# Patient Record
Sex: Female | Born: 1970
Health system: Southern US, Community
[De-identification: ages and names within clinical notes are randomized; demographics above are authoritative.]

## PROBLEM LIST (undated history)

## (undated) DIAGNOSIS — M1990 Unspecified osteoarthritis, unspecified site: Secondary | ICD-10-CM

## (undated) DIAGNOSIS — C801 Malignant (primary) neoplasm, unspecified: Secondary | ICD-10-CM

## (undated) DIAGNOSIS — K219 Gastro-esophageal reflux disease without esophagitis: Secondary | ICD-10-CM

## (undated) DIAGNOSIS — A630 Anogenital (venereal) warts: Secondary | ICD-10-CM

## (undated) DIAGNOSIS — I10 Essential (primary) hypertension: Secondary | ICD-10-CM

## (undated) HISTORY — DX: Anogenital (venereal) warts: A63.0

## (undated) HISTORY — PX: CHOLECYSTECTOMY: SHX55

## (undated) HISTORY — PX: LYMPH NODE DISSECTION: SHX5087

## (undated) HISTORY — PX: DILATION AND CURETTAGE OF UTERUS: SHX78

## (undated) HISTORY — DX: Malignant (primary) neoplasm, unspecified: C80.1

## (undated) HISTORY — DX: Gastro-esophageal reflux disease without esophagitis: K21.9

## (undated) HISTORY — PX: OTHER SURGICAL HISTORY: SHX169

---
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
## 1996-10-29 HISTORY — PX: APPENDECTOMY: SHX54

## 1997-10-29 HISTORY — PX: OTHER SURGICAL HISTORY: SHX169

## 1997-10-29 HISTORY — PX: OVARIAN CYST REMOVAL: SHX89

## 2001-09-23 ENCOUNTER — Other Ambulatory Visit: Admission: RE | Admit: 2001-09-23 | Discharge: 2001-09-23 | Payer: Self-pay | Admitting: Obstetrics and Gynecology

## 2002-05-31 ENCOUNTER — Emergency Department (HOSPITAL_COMMUNITY): Admission: EM | Admit: 2002-05-31 | Discharge: 2002-06-01 | Payer: Self-pay | Admitting: *Deleted

## 2002-12-07 ENCOUNTER — Other Ambulatory Visit: Admission: RE | Admit: 2002-12-07 | Discharge: 2002-12-07 | Payer: Self-pay | Admitting: Obstetrics and Gynecology

## 2004-01-03 ENCOUNTER — Other Ambulatory Visit: Admission: RE | Admit: 2004-01-03 | Discharge: 2004-01-03 | Payer: Self-pay | Admitting: Obstetrics and Gynecology

## 2007-04-04 ENCOUNTER — Emergency Department (HOSPITAL_COMMUNITY): Admission: EM | Admit: 2007-04-04 | Discharge: 2007-04-04 | Payer: Self-pay | Admitting: Emergency Medicine

## 2007-07-25 ENCOUNTER — Ambulatory Visit (HOSPITAL_COMMUNITY): Admission: RE | Admit: 2007-07-25 | Discharge: 2007-07-25 | Payer: Self-pay | Admitting: *Deleted

## 2007-07-25 ENCOUNTER — Encounter: Payer: Self-pay | Admitting: *Deleted

## 2007-09-18 ENCOUNTER — Inpatient Hospital Stay (HOSPITAL_COMMUNITY): Admission: AD | Admit: 2007-09-18 | Discharge: 2007-09-18 | Payer: Self-pay | Admitting: Obstetrics and Gynecology

## 2007-09-19 ENCOUNTER — Inpatient Hospital Stay (HOSPITAL_COMMUNITY): Admission: AD | Admit: 2007-09-19 | Discharge: 2007-09-19 | Payer: Self-pay | Admitting: Obstetrics and Gynecology

## 2007-09-28 ENCOUNTER — Inpatient Hospital Stay (HOSPITAL_COMMUNITY): Admission: AD | Admit: 2007-09-28 | Discharge: 2007-09-28 | Payer: Self-pay | Admitting: Obstetrics

## 2007-09-29 ENCOUNTER — Inpatient Hospital Stay (HOSPITAL_COMMUNITY): Admission: AD | Admit: 2007-09-29 | Discharge: 2007-10-19 | Payer: Self-pay | Admitting: Obstetrics

## 2007-10-15 ENCOUNTER — Encounter (INDEPENDENT_AMBULATORY_CARE_PROVIDER_SITE_OTHER): Payer: Self-pay | Admitting: Obstetrics

## 2007-11-07 ENCOUNTER — Encounter (INDEPENDENT_AMBULATORY_CARE_PROVIDER_SITE_OTHER): Payer: Self-pay | Admitting: Obstetrics and Gynecology

## 2007-11-07 ENCOUNTER — Ambulatory Visit (HOSPITAL_COMMUNITY): Admission: AD | Admit: 2007-11-07 | Discharge: 2007-11-07 | Payer: Self-pay | Admitting: Obstetrics and Gynecology

## 2009-08-12 IMAGING — US US UA ADDL GEST RE-EVAL
1 series · 14 of 16 positions shown · non-contrast
Comparison: none

OBSTETRICAL ULTRASOUND:

 This ultrasound exam was performed in the [HOSPITAL] Ultrasound Department.  The OB US report was generated in the AS system, and faxed to the ordering physician.  This report is also available in [REDACTED] PACS.

[Series 1: us ua addl gest re-eval · 0.30mm/px · 14 of 49 slices shown]
[im 1/49]
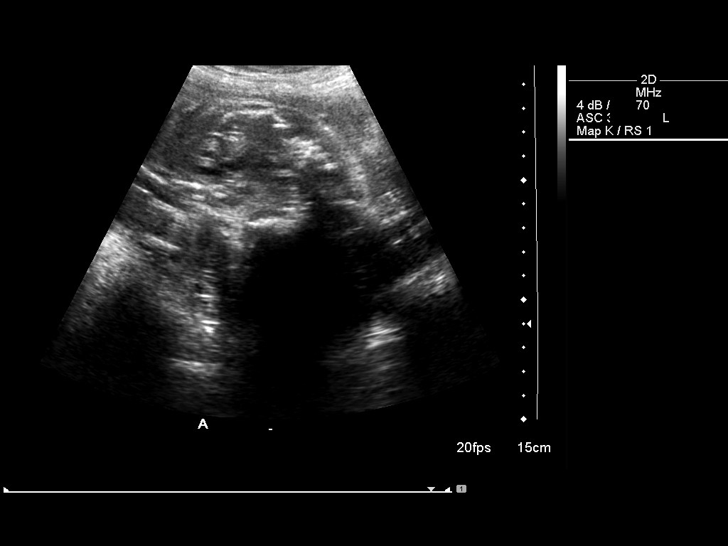
[im 4/49]
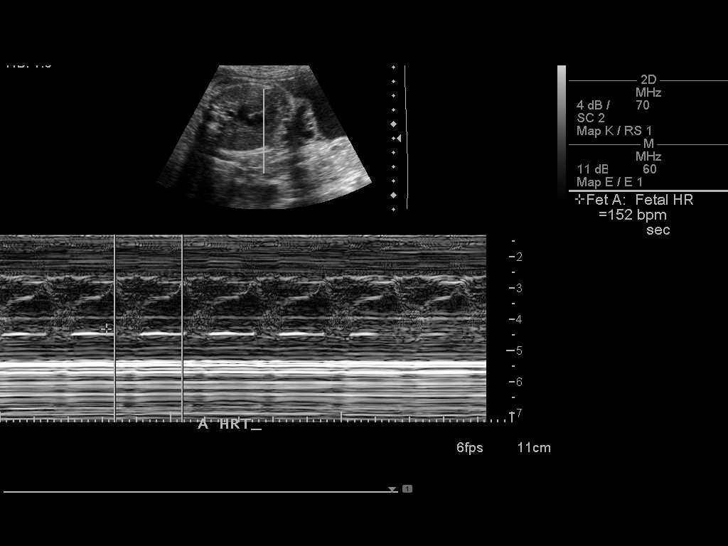
[im 7/49]
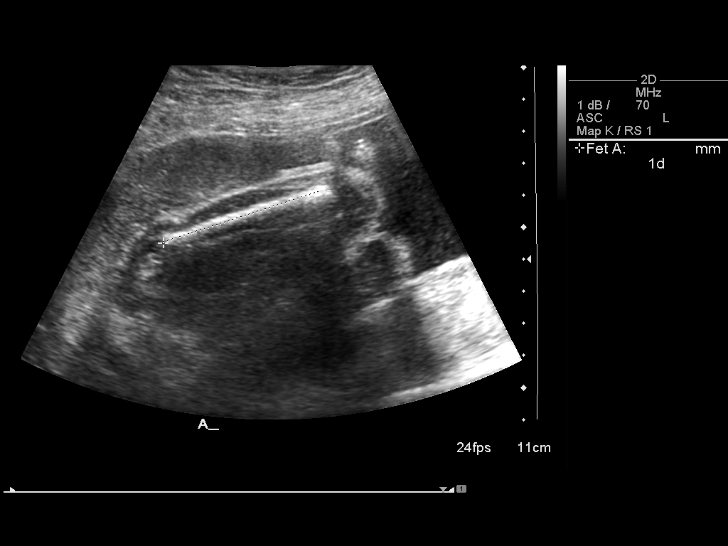
[im 13/49]
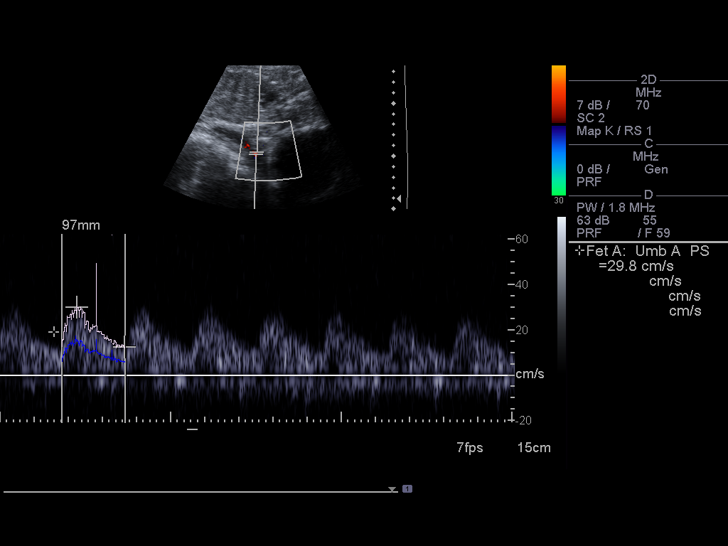
[im 17/49]
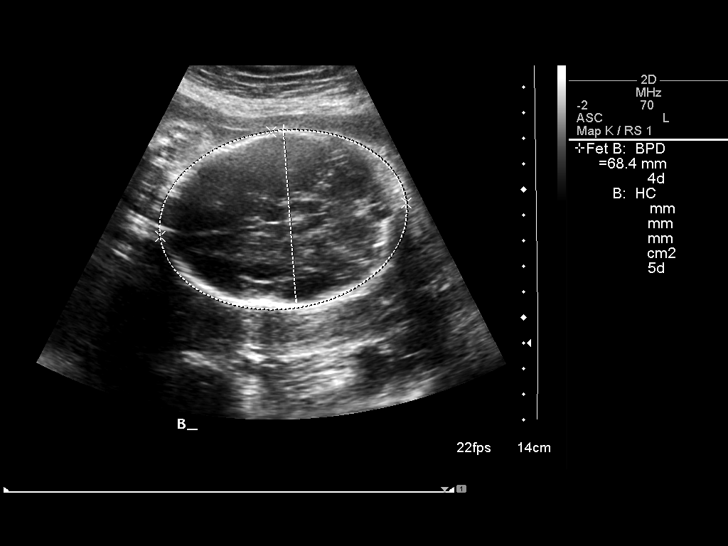
[im 20/49]
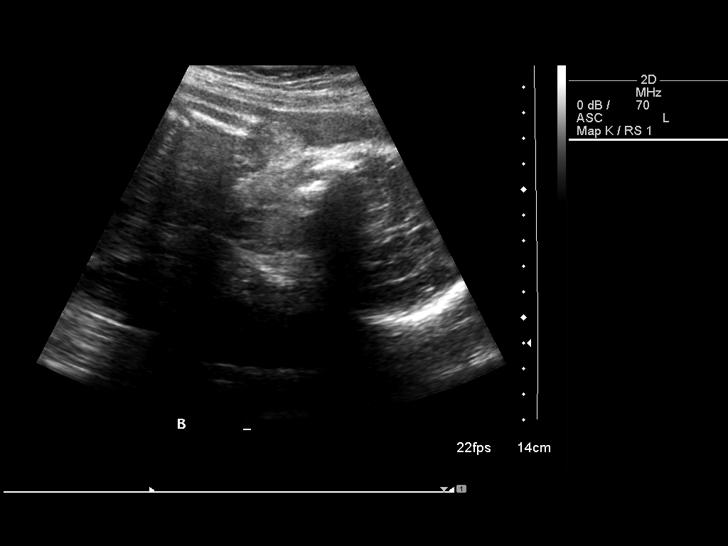
[im 23/49]
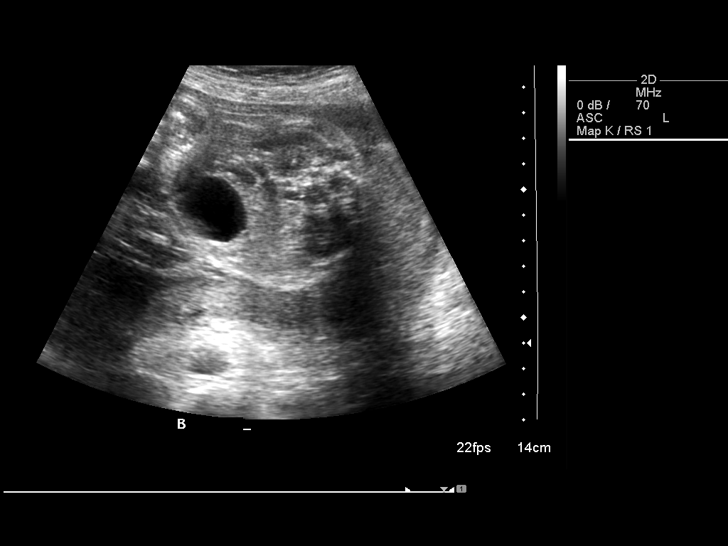
[im 26/49]
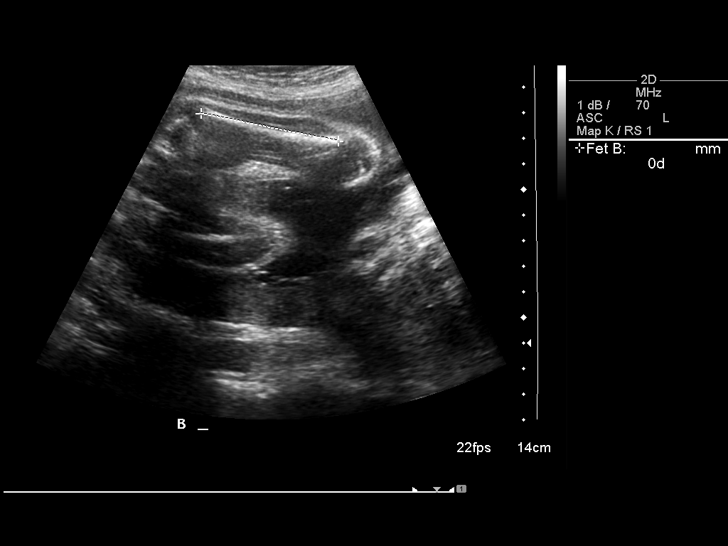
[im 29/49]
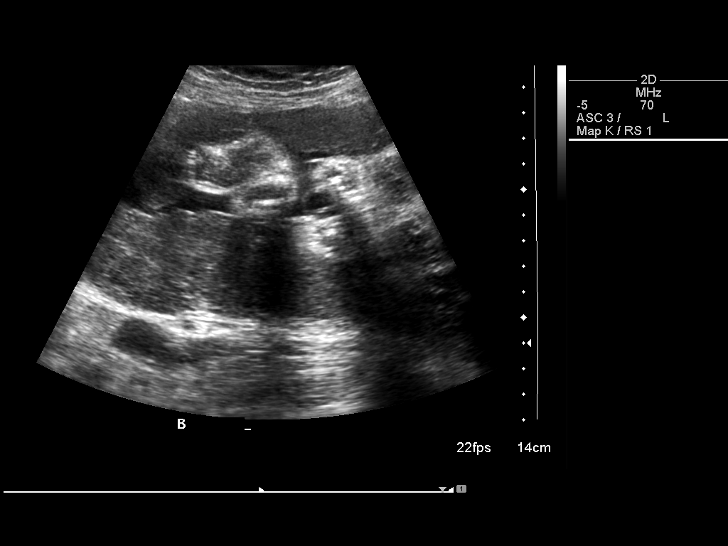
[im 33/49]
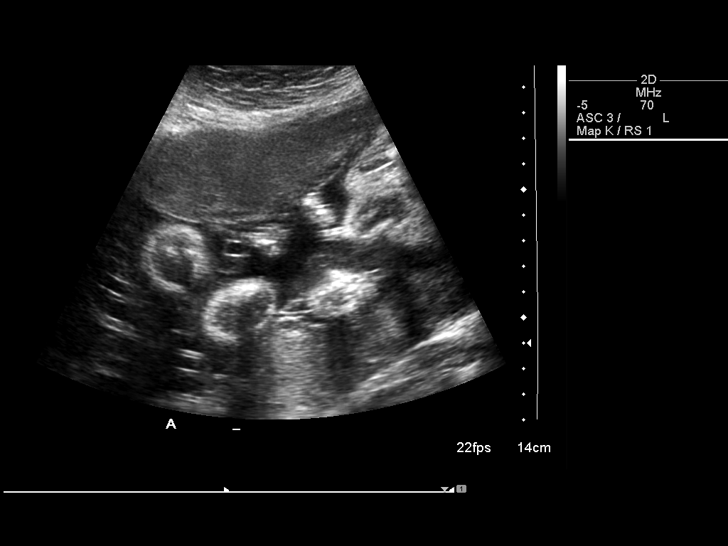
[im 39/49]
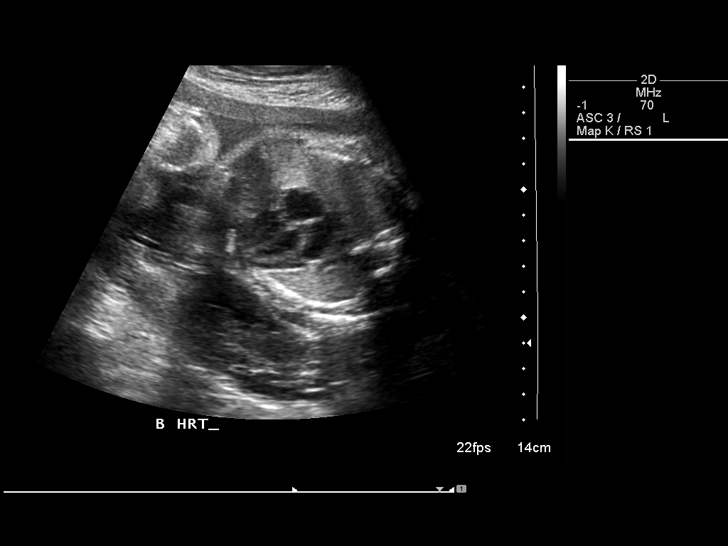
[im 42/49]
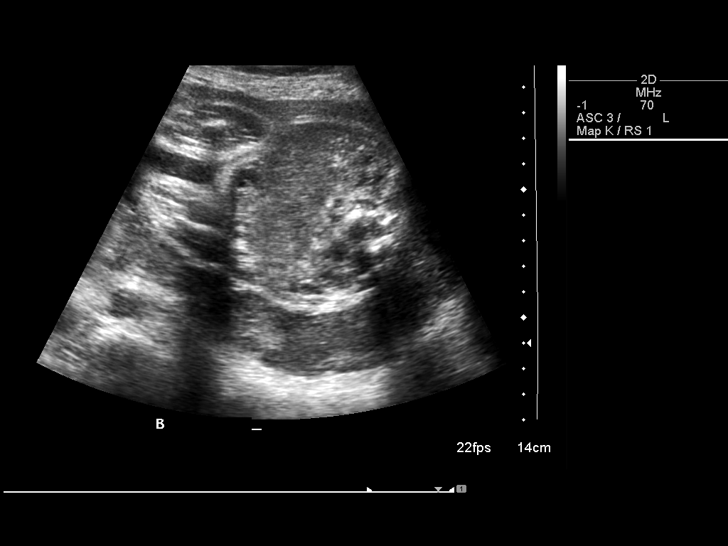
[im 45/49]
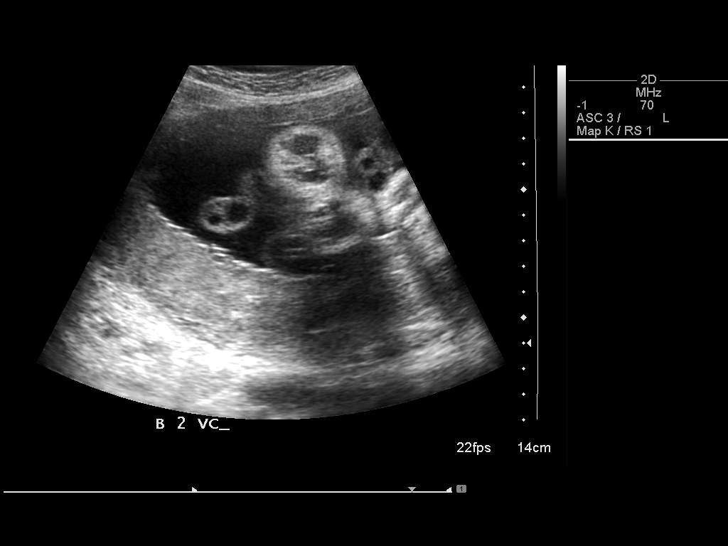
[im 49/49]
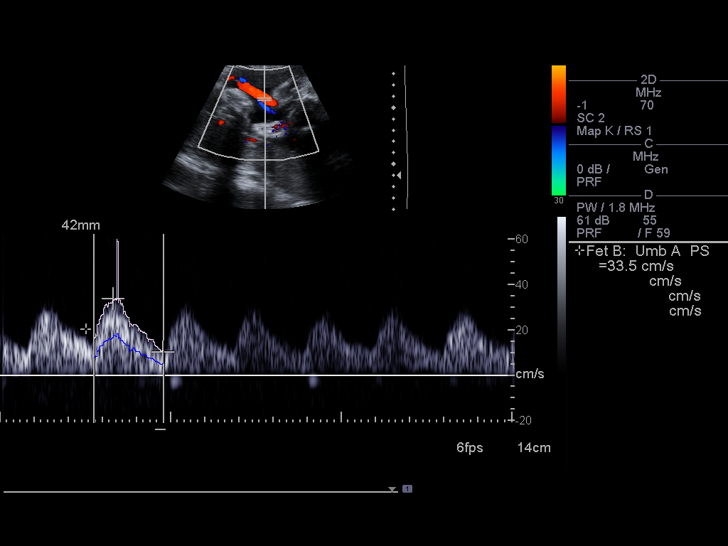

[14 of 16 positions shown; findings below may reference images not displayed]

IMPRESSION: See AS Obstetric US report.

## 2011-03-13 NOTE — Op Note (Signed)
[name] [name], [name] [name]            ACCOUNT NO.:  [id number]   MEDICAL RECORD NO.:  [id number]          PATIENT TYPE:  AMB   LOCATION:  SDC                           FACILITY:  [facility]   PHYSICIAN:  Lomita B. Earlene Plater, M.D.  DATE OF BIRTH:  12-06-1970   DATE OF PROCEDURE:  07/25/2007  DATE OF DISCHARGE:                               OPERATIVE REPORT   PREOPERATIVE DIAGNOSIS:  1. Eighteen-plus week pregnancy with twins.  2. Cervical incompetence.   PROCEDURE:  McDonald cerclage.   SURGEON:  Chester Holstein. Earlene Plater, M.D.   ASSISTANT:  None.   ANESTHESIA:  Spinal.   FINDINGS:  Shortened cervix.   BLOOD LOSS:  Minimal.   COMPLICATIONS:  None.   INDICATIONS:  Patient with 18-plus week twins, on routine ultrasound in  the office yesterday noted to have a shortened cervix in the 16-18 mm  range.  Maternal-fetal medicine consultation was obtained today.  The  [facility] and a shortened  cervix again confirmed.  There was no further shortening or dilation  Valsalva or fundal pressure; however, a persistently shortened cervix  was again noted.  The patient had a consultation with Dr. Ander Slade of MFM and  myself.  We discussed as the uncertainty of the cervical shortening in  this setting.  She has no history of preterm birth in the past.  However, this is a twin pregnancy after IVF.  Therefore, the patient was  counseled that she is at increased risk for prematurity.  There is  limited evidence for cervical cerclage; however, it is felt to be of  minimal risk and some potential benefit.  Therefore, the patient wished  to proceed with cervical cerclage.  She was advised of the risks of  surgery including infection, bleeding, damage to surrounding organs.   PROCEDURE:  The patient taken to the operating room and spinal  anesthesia obtained.  She was prepped and draped with Hibiclens.  In-and-  out catheterization of the bladder obtained.  Speculum inserted.   The  cervix was noted to be clinically shortened, about 1 cm of visible  cervical length, no dilation.  A #0 Prolene suture was placed in the  substance of the cervix in a pursestring manner with good support  obtained.  Hemostasis obtained.   The instruments were removed.  Cervix was hemostatic.  The patient  tolerated the procedure well and there were no complications.  She was  taken to the recovery room awake, alert, in stable condition.      Gerri Spore B. Earlene Plater, M.D.  Electronically Signed     WBD/MEDQ  D:  07/25/2007  T:  07/26/2007  Job:  [id number]

## 2011-03-13 NOTE — Op Note (Signed)
[name], [name]            ACCOUNT NO.:  [id number]   MEDICAL RECORD NO.:  [id number]          PATIENT TYPE:  INP   LOCATION:  9303                          FACILITY:  [facility]   PHYSICIAN:  Lendon Colonel, MD   DATE OF BIRTH:  June 05, 1971   DATE OF PROCEDURE:  10/15/2007  DATE OF DISCHARGE:                               OPERATIVE REPORT   PREOPERATIVE DIAGNOSIS:  30 week twin pregnancy, pre-term labor,  cervical insufficiency, pre-term premature rupture of membranes.   POSTOPERATIVE DIAGNOSIS:  30 week twin pregnancy, pre-term labor,  cervical insufficiency, pre-term premature rupture of membranes.   PROCEDURE:  Primary low transverse cesarean section, manual removal of  placenta, repair of bladder laceration.   ESTIMATED BLOOD LOSS:  1000 mL.   ANTIBIOTICS:  Mefoxin.   COMPLICATIONS:  Bladder laceration.   SURGEON:  Lendon Colonel, M.D.   ASSISTANT:  Marlinda Mike, C.N.M.   INTRAOPERATIVE CONSULTATIONS:  Dr. Isabel [name]- Urology.   FINDINGS:  Bladder densely adherent to anterior uterine wall.  Colon  adherent to uterine fundus.  Dense pelvic adhesions, 6 cm bladder  laceration.  Baby A formerly known as baby B in the vertex position,  Apgars 8/9.  Baby B formerly known as baby A, also in the vertex  presentation, delivered breech, Apgars 6 and 7.  The uterus densely  adherent to the left cornua.   DESCRIPTION OF PROCEDURE:  After informed consent was obtained, risks  and benefits of cesarean section, especially in light of two prior  laparotomies and history of ruptured appendix, were discussed with the  patient, the patient declined a trial of labor.  The decision was made  to proceed to the operating room for primary low transverse cesarean  section at 30 weeks given advancing labor.  After spinal anesthesia was  initiated, the patient was prepped and draped in the normal sterile  fashion in the dorsal supine position.  A Foley catheter was inserted  into the  bladder.  A Pfannenstiel skin incision was made 2 cm above the  pubic symphysis in the midline with the scalpel and carried down to the  underlying layer of fascia with the Bovie.  Of note, the old laparotomy  incisions were not used as they were felt to be too low in the pelvis.  Once the fascia was identified, the fascia was entered sharply with the  Bovie cautery and the fascial incision was extended laterally with the  Mayo scissors.  The inferior aspect of the fascial incision was grasped  with Kocher clamps, elevated up, and the underlying rectus muscles were  dissected off sharply.  Attention was turned to the superior aspect of  the fascial incision which, in a similar fashion, was grasped with  Kocher clamps x2, elevated up, and the underlying rectus muscles  dissected off sharply.  The rectus muscles were separated  in the  midline.  Dense adhesions were noted upon trying to enter the peritoneal  cavity.  Pickups and Metzenbaum scissors were used to enter into the  peritoneal cavity.  Increased vascularity was noted and great care was  taken entering the  peritoneal cavity.  During this dissection, the  bladder was entered and an approximately 6 cm incision was made in the  dome of the bladder.  The bladder edges were tagged with Allis clamps.  The peritoneal incision was extended superiorly and inferiorly.  The  bladder was then able to be dissected off the lower uterine segment.  The bladder blade was inserted.  The vesicouterine peritoneum  identified, grasped with pickups, and a bladder flap was created  sharply.  The bladder blade was reinserted to retract the bladder.  Manual exploration was done of the patient's pelvis to assess for the  uterine incision.  The decision was made to proceed with a low  transverse cesarean section.  Of note, the uterus was noted to have  dense adhesions both from the bladder inferiorly and the colon  superiorly.  The ovaries were unable to be  examined and it was felt that  the uterus would not be able to be exteriorized.   A transverse incision was made in the lower uterine segment with the  scalpel and extended with the bandage scissors.  Initial attempts at  delivering baby A were unsuccessful due to the presence of the non-  presenting twin.  The presenting twin was felt to be deep in the pelvis  and was unable to be delivered.  At this point, the amniotic sac of the  non-presenting twin was ruptured and the baby formerly known as baby B  was delivered in the vertex position as baby A.  The cord was clamped  and cut and the infant was handed off to the awaiting pediatrician.  The  presenting twin was unable to be delivered in the vertex position and  was delivered in the breech position.  The infant was delivered up to  the hips.  The arms were swept across the baby's chest and the head was  delivered and suctioned.  The cord was clamped and cut and the infant  handed off to the awaiting pediatrician.  Several minutes were spent  attempting to express the placenta.  This was unsuccessful and a portion  of the placenta was noted to be densely adherent to the left cornua.  Approximately eight minutes were spent in creating a plane between the  placenta and the left cornua.  The placenta of the non-presenting twin  delivered easily.  The placenta of the presenting twin delivered as twin  B remained stuck in the left cornua.  At this point, consideration of a  cesarean hysterectomy was given.  The patient was crossed for 2 units of  blood.  The hysterectomy tray was called for and anesthesia was alerted  to the current situation.  After about eight additional minutes, the  placenta was delivered and the placental bed site was hemostatic.  No  gross retained placenta was noted.   The incision was closed with 0 Vicryl in a running locked suture.  A  second layer of the same suture was used in an imbricating fashion.  Again,  there was no bruising on the uterus.  The uterus was not  expanding and no active bleeding was noted.  The thought at this time  was that most, if not all, of the placenta was removed.  Intraoperative  urology consult was called for and that note will be dictated as a  separate dictation.  In short, a running layer of 3-0 Vicryl was used to  primarily close the bladder incision after inspecting the ureteral  orifices.  A second layer of 2-0 Vicryl in a Lambert imbricating fashion  was used to close the bladder.  After the urologist finished his repair,  the uterine incision was reinspected and found to be hemostatic.  The  uterus was found to be firm with no bruising and no bleeding.  The  tagged uterine incisions were cut.  The uterus was again unable to be  exteriorized.  The adnexa were unable to be assessed secondary to dense  adhesions throughout the pelvis.  A 2-0 Vicryl was used to reapproximate  the rectus muscles in the midline to help keep the bladder from adhering  directly to the fascia.  The fascia was closed with 0 Vicryl in a  running locked fashion after cut muscle edges and under side of the  fascia were inspected and found to be hemostatic.  The skin was closed  with staples.  She tolerated the procedure well.  Sponge, lap and needle  counts were correct x 3.  The patient was taken to the recovery area in  stable condition.      Lendon Colonel, MD  Electronically Signed     KAF/MEDQ  D:  10/15/2007  T:  10/15/2007  Job:  [id number]

## 2011-03-13 NOTE — Discharge Summary (Signed)
NAMESHAKEIRA, RHEE            ACCOUNT NO.:  [id number]   MEDICAL RECORD NO.:  [id number]          PATIENT TYPE:  INP   LOCATION:  9303                          [facility]ACILITY:  [facility]   PHYSICIAN:  Lendon Colonel, MD   DATE OF BIRTH:  July 22, 1971   DATE OF ADMISSION:  09/29/2007  DATE OF DISCHARGE:  10/19/2007                               DISCHARGE SUMMARY   CHIEF COMPLAINT:  PPROM twin A.   HISTORY OF PRESENT ILLNESS:  This is a 40 year old G1 admitted at 27  weeks and 5 days after premature preterm rupture of membranes of Baby A.  Of note, patient's pregnancy is conceived via in vitro fertilization  with di-di twins approximately 19 weeks of gestation.  Patient was noted  to have asymptomatic cervical shortening and dilation.  At that time  patient was placed on progesterone vaginal suppositories and emergent  cerclage was placed.  After that, patient was discharged to home with  weekly followup for cerclage checks.  On September 28, 2007, patient  presented to the ER with complaint of leaking of fluid.  Fern, Nitrazine  and pooling test were negative and patient was discharged to home.  The  following day patient had increased fluid from vagina and rupture of  membranes of Twin A was confirmed.  The patient was admitted to the  hospital with IV antibiotics for 2 days and a p.o. course for the  remaining 5 days.  A discussion on mode of delivery was had with the  patient.  The decision was made to proceed with cesarean section for  mode of delivery.  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  Patient was told at her most  recent laparotomy that she does have a good deal of scar tissue in her  abdomen.  This was noted by all physicians.  On December 1, after  initiation of antibiotics, patient was given two doses of betamethasone.  An ultrasound was performed for  fetal weight and position and NICU  consult was obtained.  On admission, patient showed no signs or symptoms  of chorioamnionitis and BPP on fetal A was 6/8 with normal uterine  artery Dopplers.  Baby B was noted to have a two-vessel cord but  reactive testing.  Fetal monitoring of Baby A was difficult secondary to  the oligohydramnios and the low position in the pelvis.  Given the  inability to monitor fetus A for the first week of her in-house  observation, patient was getting daily BPPs.  Will continue this  dictation at a later time.      Lendon Colonel, MD  Electronically Signed     KAF/MEDQ  D:  10/19/2007  T:  10/20/2007  Job:  [id number]

## 2011-03-13 NOTE — Op Note (Signed)
NAMEYAZMEN, [name] [name]            ACCOUNT NO.:  [id number]   MEDICAL RECORD NO.:  [id number]          PATIENT TYPE:  INP   LOCATION:  9303                          [facility]   PHYSICIAN:  Valetta Fuller, M.D.  DATE OF BIRTH:  05/22/1971   DATE OF PROCEDURE:  DATE OF DISCHARGE:                               OPERATIVE REPORT   PREOPERATIVE DIAGNOSIS:  Inadvertent cystotomy during cesarean section.   POSTOPERATIVE DIAGNOSIS:  Inadvertent cystotomy during cesarean section.   PROCEDURE PERFORMED:  Intraoperative consultation with closure of large  cystotomy.   SURGEON:  Dr. Barron Alvine.   ASSISTANT:  Dr. Noland Fordyce.   ANESTHETIC:  Spinal.   INDICATIONS:  [name] [name] is a 40 year old female who had undergone  cesarean section for twin gestation.  During the procedure which was  complicated by previous laparotomies and some adhesions, a cystotomy was  created at the dome of the bladder.  This was approximately 7-8 cm in  length.  Careful inspection of the bladder revealed no injury down at  the trigone.  The cystotomy was limited to the extreme dome of the  bladder.  The bladder wall appeared reasonably thick along the entire  cystotomy and had excellent blood supply.  Closure was performed with a  running 3-0 Vicryl suture incorporating the mucosa and some of the  muscularis.  Interrupted Lembert sutures were then placed to provide a  second layer of closure along the entire cystotomy incision.  We  instructed nursing personnel to please change her 16-French catheter to  a larger catheter, presumably 20-22 [place].  We suggested that the  patient have a cystogram 7-10 days postoperatively and if no leakage  occurred to go ahead and remove the catheter with antibiotic coverage  for approximately 5 days starting a day or so prior to the cystogram and  voiding trial.  The rest of this operative report including the cesarean  section, closure, etc. will be dictated separately  by Dr. Ernestina Penna. No  other obvious problems or complications occurred.           ______________________________  Valetta Fuller, M.D.  Electronically Signed     DSG/MEDQ  D:  10/15/2007  T:  10/15/2007  Job:  [id number]

## 2011-03-13 NOTE — Discharge Summary (Signed)
[name] [name], [name] [name]            ACCOUNT NO.:  [id number]   MEDICAL RECORD NO.:  [id number]          PATIENT TYPE:  INP   LOCATION:  9303                          [facility]   PHYSICIAN:  Lendon Colonel, MD   DATE OF BIRTH:  1970/12/10   DATE OF ADMISSION:  09/29/2007  DATE OF DISCHARGE:  10/19/2007                               DISCHARGE SUMMARY   On December 5th, patient had new onset of vaginal bleeding and passage  of large clot.  She was afebrile.  Her vital signs were stable and  tocometry was now picking up contractions every 6 minutes.  Baby B was  reactive and appropriate for gestational age, as was baby A although  monitoring was intermittent.  A sterile speculum exam was performed  which noted 1-2 spots of blood in the vagina, small clot at the os.  The  cervix had appeared closed and 1 cm long.  The cervix appeared intact at  that time.  A CBC was checked which resulted as normal in attempt to  rule out chorioamnionitis.  Plan was for a primary C-section should  contractions continue.  Over the next several hours, contractions  continued despite normal white blood cell count.  Given continued  contractions, decision was made to cut the cerclage.  This was done  without complication.  An ultrasound was performed.  Baby A had an  anterior placenta with oligohydramnios and baby B with posterior  placenta with normal fluids.  Decision was made to proceed with  expectant management and continuation of latency antibiotics.  After  that episode, patient continued to do well with no further signs or  symptoms of chorio or advancing preterm labor.  During ultrasound  monitoring, it was noticed there was discordant growth of twin A for  which a BPP, Dopplers, and daily NSTs were carried out.  One week after  completion of steroids, a 3-hour GTT was done which was within normal  limits.  On December 11th, monitoring was changed given reassuring  status of both babies and the  plan was to repeat growth scan 2 weeks  from the last, discontinue the daily BPPs, and continue daily NST.  BPPs  would be performed at that time only with nonreassuring NSTs.  Patient  was started on Protonix and physical therapy was called for bed  exercises.  Patient had intermittent elevated and blood pressures with  normal pH labs.  On December 17th, patient had worsening of intensity of  her contractions.  Contractions were noted by the patient and on the  tocometer every 1 to 3 minutes.  Her fundus was nontender.  Her white  count was 8.1.  Both babies appeared reactive with no decelerations.  Patient was rechecked 1 hour later with cervical change noted from 1 to  2 cm and increased effacement.  Decision was made to proceed with  primary cesarean section that can be found in a separate dictation.  Of  note, during the C-section, an approximately 6-cm laceration on the dome  of the bladder was noted and repaired primarily by urology.  The  remainder  of the C-section was complicated by placental tissue that was  adherent to the left cornua but eventually freed.  Patient did have an  EBL of 900 mL.  Postoperatively, patient's hemoglobin dropped to 7.0.  She received 2 units of blood transfusion.  She remained on 24 hours of  amp and gentamicin prophylaxis.  Otherwise, patient had a normal  postoperative course with prolonged Foley catheterization.  By postop  day #4, patient was ambulating, voiding without difficulty, passing  flatus, tolerating regular p.o., and her pain was controlled with p.o.  medicines.  She is denying chest pain, shortness of breath, or  dizziness.  Her exam was remarkable only for appropriate amount of  tenderness.  Her incision was clean, dry, and intact and staples were  removed.  Steris were placed.  Patient was given leg bag teaching and  plan was for followup with urology in 2 weeks' time for cystogram.  Patient will start on Macrobid prophylaxis 2 days  prior to the  cystogram.  She was discharged to home.   DISCHARGE CONDITION:  Stable.   DISCHARGE DIAGNOSES:  Status post 30-week primary C-section for in vitro  fertilization twins after preterm premature rupture of membranes and  subsequent development of preterm labor also with cervical incompetence.      Lendon Colonel, MD  Electronically Signed     KAF/MEDQ  D:  10/19/2007  T:  10/20/2007  Job:  [id number]

## 2011-03-13 NOTE — H&P (Signed)
[name], [name] [name]            ACCOUNT NO.:  [id number]   MEDICAL RECORD NO.:  [id number]          PATIENT TYPE:  MAT   LOCATION:  MATC                          FACILITY:  [facility]   PHYSICIAN:  Lendon Colonel, MD   DATE OF BIRTH:  [birth year]   DATE OF ADMISSION:  09/28/2007  DATE OF DISCHARGE:                              HISTORY & PHYSICAL   CHIEF COMPLAINT:  Wet panties.   HISTORY OF PRESENT ILLNESS:  This is a 40 year old gravida 1, para 0 at  27-4/7 by in vitro fertilization with twins who presents for a question  of ruptured membranes.  Of note, the patient's pregnancy has been  complicated by shortened cervix noted at 19-week anatomy scan.  Cervix  at that time was found to be 1.8 cm.  The patient subsequently had a  cerclage and began on progesterone vaginal suppositories.  Since that  time, her cervix has been followed by transvaginal ultrasounds.  Most  recently on October 30, her cervix was found to be 2.1 cm.  On November  20 the patient's examination was found to have a cervix that was closed  but 90% effaced.  At that point she had a fetal fibronectin which  returned as positive and the patient was given betamethasone.  On  November 28, the patient's cervix was fingertip dilated, 90% effaced.  Today the patient notes active fetal movement x2, no vaginal bleeding,  no contractions, no fevers, and has had nothing per vagina for the past  24 hours.  The patient notes overnight she has had moisture in her  underwear, however, her underwear on arrival to [facility] were dry.   PAST MEDICAL HISTORY:  Significant for prior laparotomy for appendectomy  and infertility.   ALLERGIES:  No known drug allergies.   MEDICATIONS:  1. Prenatal vitamins.  2. Progesterone vaginal suppositories.   SOCIAL HISTORY:  No alcohol, no drugs, and nonsmoker.   PHYSICAL EXAMINATION:  VITAL SIGNS:  Blood pressure 138/80, pulse 105,  temperature 98.3.  CARDIOVASCULAR:  Regular rate and rhythm.  LUNGS:  Clear to auscultation bilaterally.  ABDOMEN:  Nontender, no fundal tenderness.  GENITOURINARY:  Cervix closed, 100% effaced, no tension on the cerclage.  Fetal vertex was felt ballotable through the cerclage.  Nitrazine was  negative, pool was negative.  A dry slide was done and ferning was  negative.  A wet prep was done which showed numerous Lactobacilleae,  rare clue cells, but 20 white blood cells per high powered field.  No  yeast and no Trichomonas.  Tocometer was negative for contractions.  Fetal heart rate baby A was in the 160's with good accelerations, 10  beat variability and no decelerations.  Baby B was unable to be traced,  but on Doppler had a heart rate in the 150's with audible accelerations.  Both babies were appropriate for gestational age.   ASSESSMENT:  This is a 40 year old gravida 1, at 27-4/7 weeks with IVF  twins and history of shortened cervix status post cerclage who has ruled  out for rupture of membranes, no evidence of advancing cervical  dilatation, no evidence for  preterm labor, and no evidence of maternal  infection.  The patient was discharged to home with  continued  bed rest, kick counts, and return for any increased leakage  of fluid, abdominal pain or fever.  The patient was to follow up at the  office on Wednesday for growth scan.  She was instructed to return  earlier with any change in symptoms and FSN was sent today and is  pending.      Lendon Colonel, MD  Electronically Signed     KAF/MEDQ  D:  09/28/2007  T:  09/28/2007  Job:  [id number]

## 2011-03-13 NOTE — Op Note (Signed)
NAMEMALAIA, [name] [name]            ACCOUNT NO.:  [id number]   MEDICAL RECORD NO.:  [id number]          PATIENT TYPE:  AMB   LOCATION:  [facility]                          FACILITY:  [facility]   PHYSICIAN:  Lenoard Aden, M.D.DATE OF BIRTH:  [birth year]   DATE OF PROCEDURE:  11/07/2007  DATE OF DISCHARGE:                               OPERATIVE REPORT   PREOPERATIVE DIAGNOSIS:  Retained products of conception.   POSTOPERATIVE DIAGNOSIS:  Probable retained blood clot and no placental  tissue seen.   PROCEDURE:  Suction D and E.   SURGEON:  Lenoard Aden, M.D.   ANESTHESIA:  MAC.   ESTIMATED BLOOD LOSS:  Fifty mL.   COMPLICATIONS:  None.   PATHOLOGY:  Specimen to pathology.   CONDITION:  The patient to recovery in good condition.   PROCEDURE IN DETAIL:  After being apprised of the risks of anesthesia,  infection, bleeding, uterine perforation, possible need for repair,  possible need for hysterectomy the patient was brought to the operating  room and she was administered IV sedation without difficulty, prepped  and draped in usual sterile fashion, catheterized until the bladder was  empty.  Exam under anesthesia was done in cervical os and blood clot  extruding from the cervix.  At this time the cervix was gently sounded  and sounds to about 12 cm and 29 Pratt dilators placed without  difficulty and the 9 mm suction curette placed.  Initiation reveals  small amount of blood clot which is in the lower uterine segment.  The  suction curette was carefully advanced to the fundus which reveals no  evidence of tissue.  The cavity was probed using a blunt curette  revealing no evidence of tissue.  Good hemostasis noted.  All  instruments removed.  The patient was awakened and transferred to  recovery in good condition.      Lenoard Aden, M.D.  Electronically Signed     RJT/MEDQ  D:  11/07/2007  T:  11/08/2007  Job:  [id number]

## 2011-07-19 LAB — TYPE AND SCREEN
ABO/RH(D): O POS
Antibody Screen: NEGATIVE

## 2011-07-19 LAB — DIFFERENTIAL
Basophils Absolute: 0
Eosinophils Absolute: 0.1
Monocytes Relative: 7

## 2011-07-19 LAB — HCG, QUANTITATIVE, PREGNANCY: hCG, Beta Chain, Quant, S: 640 — ABNORMAL HIGH

## 2011-07-19 LAB — CBC
HCT: 32.2 — ABNORMAL LOW
Hemoglobin: 11.3 — ABNORMAL LOW
MCV: 89.2
RDW: 15.9 — ABNORMAL HIGH
WBC: 8.6

## 2011-08-03 LAB — CBC
HCT: 20 — ABNORMAL LOW
HCT: 25.5 — ABNORMAL LOW
HCT: 29.9 — ABNORMAL LOW
HCT: 31.7 — ABNORMAL LOW
Hemoglobin: 10.4 — ABNORMAL LOW
Hemoglobin: 11.1 — ABNORMAL LOW
Hemoglobin: 7 — CL
Hemoglobin: 9 — ABNORMAL LOW
MCHC: 34.6
MCHC: 34.6
MCHC: 34.8
MCHC: 35.2
MCV: 88.8
MCV: 89.8
MCV: 91.3
MCV: 92.2
Platelets: 232
Platelets: 270
Platelets: 272
Platelets: 282
Platelets: 318
RBC: 2.2 — ABNORMAL LOW
RBC: 2.64 — ABNORMAL LOW
RBC: 2.87 — ABNORMAL LOW
RBC: 3.24 — ABNORMAL LOW
RBC: 3.48 — ABNORMAL LOW
RDW: 16.1 — ABNORMAL HIGH
RDW: 16.6 — ABNORMAL HIGH
RDW: 16.7 — ABNORMAL HIGH
WBC: 16.9 — ABNORMAL HIGH
WBC: 17.8 — ABNORMAL HIGH
WBC: 7.6
WBC: 8.1

## 2011-08-03 LAB — TYPE AND SCREEN
ABO/RH(D): O POS
Antibody Screen: NEGATIVE

## 2011-08-03 LAB — DIC (DISSEMINATED INTRAVASCULAR COAGULATION)PANEL
INR: 0.9
Platelets: 232
Prothrombin Time: 12.7
aPTT: 28

## 2011-08-03 LAB — GENTAMICIN LEVEL, RANDOM: Gentamicin Rm: 2.5

## 2011-08-06 LAB — CBC
HCT: 31 — ABNORMAL LOW
HCT: 31.5 — ABNORMAL LOW
Hemoglobin: 10.8 — ABNORMAL LOW
Hemoglobin: 10.9 — ABNORMAL LOW
MCHC: 34.6
MCHC: 34.6
MCHC: 34.6
MCHC: 34.7
MCV: 89.9
MCV: 90.8
Platelets: 237
Platelets: 269
RBC: 3.13 — ABNORMAL LOW
RBC: 3.29 — ABNORMAL LOW
RBC: 3.39 — ABNORMAL LOW
RBC: 3.41 — ABNORMAL LOW
RDW: 14.8
RDW: 15.4
RDW: 15.5
RDW: 15.9 — ABNORMAL HIGH
WBC: 7.8

## 2011-08-06 LAB — RPR: RPR Ser Ql: NONREACTIVE

## 2011-08-06 LAB — ABO/RH: ABO/RH(D): O POS

## 2011-08-06 LAB — GLUCOSE, 1 HOUR GESTATIONAL: Glucose Tolerance, 1 hour: 133

## 2011-08-06 LAB — GLUCOSE, FASTING GESTATIONAL
Glucose Tolerance, Fasting: 79
Glucose Tolerance, Fasting: 79

## 2011-08-06 LAB — TYPE AND SCREEN
Antibody Screen: NEGATIVE
Antibody Screen: NEGATIVE

## 2011-08-06 LAB — AST: AST: 33

## 2011-08-06 LAB — DIFFERENTIAL
Basophils Relative: 1
Eosinophils Absolute: 0.1 — ABNORMAL LOW
Monocytes Absolute: 0.7
Monocytes Relative: 7
Neutrophils Relative %: 72

## 2011-08-06 LAB — CREATININE, SERUM
Creatinine, Ser: 0.49
GFR calc Af Amer: >60

## 2011-08-06 LAB — STREP B DNA PROBE: Strep Group B Ag: NEGATIVE

## 2011-08-06 LAB — URIC ACID: Uric Acid, Serum: 4.3

## 2011-08-09 LAB — DIFFERENTIAL
Basophils Absolute: 0
Basophils Relative: 0
Eosinophils Absolute: 0.1
Monocytes Absolute: 0.7
Monocytes Relative: 7
Neutro Abs: 7.6
Neutrophils Relative %: 77

## 2011-08-09 LAB — CBC
Hemoglobin: 10.2 — ABNORMAL LOW
MCHC: 34.8
MCV: 88.1
RBC: 3.33 — ABNORMAL LOW
RDW: 14.3 — ABNORMAL HIGH

## 2013-12-09 ENCOUNTER — Ambulatory Visit (INDEPENDENT_AMBULATORY_CARE_PROVIDER_SITE_OTHER): Payer: Managed Care, Other (non HMO) | Admitting: Physician Assistant

## 2013-12-09 VITALS — BP 130/100 | HR 105 | Temp 99.7°F | Resp 16 | Ht 63.5 in | Wt 160.0 lb

## 2013-12-09 DIAGNOSIS — J111 Influenza due to unidentified influenza virus with other respiratory manifestations: Secondary | ICD-10-CM

## 2013-12-09 DIAGNOSIS — R509 Fever, unspecified: Secondary | ICD-10-CM

## 2013-12-09 DIAGNOSIS — J101 Influenza due to other identified influenza virus with other respiratory manifestations: Secondary | ICD-10-CM

## 2013-12-09 LAB — POCT INFLUENZA A/B
INFLUENZA A, POC: POSITIVE
INFLUENZA B, POC: NEGATIVE

## 2013-12-09 MED ORDER — MELOXICAM 15 MG PO TABS: 15.0000 mg | ORAL_TABLET | Freq: Every day | ORAL | Status: DC

## 2013-12-09 MED ORDER — OSELTAMIVIR PHOSPHATE 75 MG PO CAPS: 75.0000 mg | ORAL_CAPSULE | Freq: Two times a day (BID) | ORAL | Status: AC

## 2013-12-09 NOTE — Progress Notes (Signed)
   Subjective:    Patient ID: [name] [name], female    DOB: Mar 02, 1971, 43 y.o.   MRN: [id number]  Fever  Associated symptoms include congestion, ear pain and headaches. Pertinent negatives include no coughing, rash, sore throat or wheezing.   Patient reports onset late yesterday of body aches then today developed fever, chills, headache, mild nasal drainage, congestion, post nasal drip, sneezing, and ear pressure.  She also reports decreased appetite and fatigue  She denies sore throat and trouble swallowing, cough, shortness of breath and wheezing. She denies recent sick contact.  She reports she received her flu shot this flu season.  She did miss work today due to illness.  She has taken dayquil with little relief.   Review of Systems  Constitutional: Positive for fever, chills, appetite change and fatigue.  HENT: Positive for congestion, ear pain, postnasal drip, rhinorrhea and sneezing. Negative for sinus pressure, sore throat and trouble swallowing.   Eyes: Negative.   Respiratory: Negative for cough, shortness of breath and wheezing.   Cardiovascular: Negative.   Gastrointestinal: Negative.   Musculoskeletal: Positive for back pain (that she reports is related to the body aches) and myalgias. Negative for neck stiffness.  Skin: Negative for rash.  Neurological: Positive for light-headedness (reports her head feels pressurized) and headaches.      Objective:   Physical Exam  Vitals reviewed. Constitutional: She is oriented to person, place, and time. She appears well-developed and well-nourished. No distress.  HENT:  Head: Normocephalic and atraumatic.  Right Ear: External ear normal.  Left Ear: External ear normal.  Nose: Nose normal.  Mouth/Throat: Oropharynx is clear and moist. No oropharyngeal exudate.  Abnormal scarring from tonsillectomy on the left  Eyes: Conjunctivae are normal. Pupils are equal, round, and reactive to light.  Neck: Normal range of motion. Neck  supple.  Cardiovascular: Normal rate and regular rhythm.   Tachycardia noted with vitals not appreciated on physical examination  Pulmonary/Chest: Effort normal and breath sounds normal. No respiratory distress.  Lymphadenopathy:       Head (right side): No tonsillar, no preauricular, no posterior auricular and no occipital adenopathy present.       Head (left side): No tonsillar, no preauricular, no posterior auricular and no occipital adenopathy present.    She has no cervical adenopathy (mild tenderness).  Neurological: She is alert and oriented to person, place, and time.  Skin: Skin is warm and dry.  Psychiatric: She has a normal mood and affect. Her behavior is normal. Judgment and thought content normal.   Results for orders placed in visit on 12/09/13  POCT INFLUENZA A/B      Result Value Ref Range   Influenza A, POC Positive     Influenza B, POC Negative         Assessment & Plan:   1. Fever and chills - POCT Influenza A/B Secondary to influenza  2. Influenza A - oseltamivir (TAMIFLU) 75 MG capsule; Take 1 capsule (75 mg total) by mouth 2 (two) times daily.  Dispense: 10 capsule; Refill: 0 - meloxicam (MOBIC) 15 MG tablet; Take 1 tablet (15 mg total) by mouth daily.  Dispense: 30 tablet; Refill: 0  Supportive care, anticipatory guidance provided, RTC prn

## 2013-12-09 NOTE — Progress Notes (Signed)
I have examined this patient along with the student and agree.  

## 2013-12-09 NOTE — Patient Instructions (Signed)

## 2014-10-29 HISTORY — PX: LYMPH NODE DISSECTION: SHX5087

## 2015-05-04 ENCOUNTER — Other Ambulatory Visit: Payer: Self-pay | Admitting: Surgery

## 2015-05-23 ENCOUNTER — Other Ambulatory Visit: Payer: Self-pay | Admitting: Surgery

## 2015-05-26 ENCOUNTER — Ambulatory Visit: Payer: Self-pay | Admitting: Family Medicine

## 2016-07-31 LAB — HM PAP SMEAR

## 2017-01-25 DIAGNOSIS — H01119 Allergic dermatitis of unspecified eye, unspecified eyelid: Secondary | ICD-10-CM | POA: Diagnosis not present

## 2017-02-21 DIAGNOSIS — L814 Other melanin hyperpigmentation: Secondary | ICD-10-CM | POA: Diagnosis not present

## 2017-02-21 DIAGNOSIS — D1801 Hemangioma of skin and subcutaneous tissue: Secondary | ICD-10-CM | POA: Diagnosis not present

## 2017-02-21 DIAGNOSIS — D225 Melanocytic nevi of trunk: Secondary | ICD-10-CM | POA: Diagnosis not present

## 2017-05-30 DIAGNOSIS — Z803 Family history of malignant neoplasm of breast: Secondary | ICD-10-CM | POA: Diagnosis not present

## 2017-05-30 DIAGNOSIS — Z1231 Encounter for screening mammogram for malignant neoplasm of breast: Secondary | ICD-10-CM | POA: Diagnosis not present

## 2017-05-30 LAB — HM MAMMOGRAPHY

## 2017-09-17 ENCOUNTER — Ambulatory Visit (INDEPENDENT_AMBULATORY_CARE_PROVIDER_SITE_OTHER): Payer: 59 | Admitting: Family Medicine

## 2017-09-17 ENCOUNTER — Encounter: Payer: Self-pay | Admitting: Family Medicine

## 2017-09-17 VITALS — BP 160/110 | HR 90 | Ht 63.0 in | Wt 177.6 lb

## 2017-09-17 DIAGNOSIS — R030 Elevated blood-pressure reading, without diagnosis of hypertension: Secondary | ICD-10-CM | POA: Diagnosis not present

## 2017-09-17 DIAGNOSIS — R1030 Lower abdominal pain, unspecified: Secondary | ICD-10-CM

## 2017-09-17 DIAGNOSIS — R1012 Left upper quadrant pain: Secondary | ICD-10-CM

## 2017-09-17 DIAGNOSIS — K219 Gastro-esophageal reflux disease without esophagitis: Secondary | ICD-10-CM

## 2017-09-17 DIAGNOSIS — Z23 Encounter for immunization: Secondary | ICD-10-CM | POA: Diagnosis not present

## 2017-09-17 DIAGNOSIS — I10 Essential (primary) hypertension: Secondary | ICD-10-CM | POA: Insufficient documentation

## 2017-09-17 DIAGNOSIS — Z13220 Encounter for screening for lipoid disorders: Secondary | ICD-10-CM

## 2017-09-17 LAB — POC URINALSYSI DIPSTICK (AUTOMATED)
BILIRUBIN UA: NEGATIVE
Blood, UA: NEGATIVE
GLUCOSE UA: NEGATIVE
KETONES UA: NEGATIVE
Leukocytes, UA: NEGATIVE
Nitrite, UA: NEGATIVE
PROTEIN UA: NEGATIVE
Spec Grav, UA: 1.025 (ref 1.010–1.025)
Urobilinogen, UA: 0.2 E.U./dL
pH, UA: 6 (ref 5.0–8.0)

## 2017-09-17 LAB — COMPREHENSIVE METABOLIC PANEL
ALK PHOS: 27 U/L — AB (ref 39–117)
ALT: 20 U/L (ref 0–35)
AST: 20 U/L (ref 0–37)
Albumin: 4.6 g/dL (ref 3.5–5.2)
BUN: 11 mg/dL (ref 6–23)
CHLORIDE: 104 meq/L (ref 96–112)
CO2: 28 mEq/L (ref 19–32)
Calcium: 9.1 mg/dL (ref 8.4–10.5)
Creatinine, Ser: 0.7 mg/dL (ref 0.40–1.20)
GFR: 94.84 mL/min (ref >60.00)
GLUCOSE: 100 mg/dL — AB (ref 70–99)
POTASSIUM: 4 meq/L (ref 3.5–5.1)
Sodium: 137 mEq/L (ref 135–145)
TOTAL PROTEIN: 7.2 g/dL (ref 6.0–8.3)
Total Bilirubin: 0.6 mg/dL (ref 0.2–1.2)

## 2017-09-17 LAB — CBC
HCT: 40.2 % (ref 36.0–46.0)
HEMOGLOBIN: 13.4 g/dL (ref 12.0–15.0)
MCHC: 33.3 g/dL (ref 30.0–36.0)
MCV: 91.2 fl (ref 78.0–100.0)
Platelets: 258 10*3/uL (ref 150.0–400.0)
RBC: 4.41 Mil/uL (ref 3.87–5.11)
RDW: 14.2 % (ref 11.5–15.5)
WBC: 4.6 10*3/uL (ref 4.0–10.5)

## 2017-09-17 LAB — LIPID PANEL
Cholesterol: 212 mg/dL — ABNORMAL HIGH (ref 0–200)
HDL: 81 mg/dL (ref >39.00)
LDL Cholesterol: 116 mg/dL — ABNORMAL HIGH (ref 0–99)
NONHDL: 131.17
Total CHOL/HDL Ratio: 3
Triglycerides: 74 mg/dL (ref 0.0–149.0)
VLDL: 14.8 mg/dL (ref 0.0–40.0)

## 2017-09-17 NOTE — Progress Notes (Signed)
Subjective:  [name] [name] is a 46 y.o. female who presents today with a chief complaint of lower abdominal pain and to establish care.   HPI:  Lower Abdominal Pain, acute issue Symptoms started about a week ago.  Described as a dull ache in her lower abdomen.  Symptoms have improved over that time.  She has not had any symptoms over the last day or 2.  No nausea or vomiting.  No constipation or diarrhea.  Normal bowel movement yesterday.  No fevers or chills.  No dysuria.  She had a mild episode of back pain a few days ago, but none since.  No treatments tried.  No current symptoms.  She has had 3 intra-abdominal procedures in the past including appendectomy, cesarean section, and ovarian cyst surgery.  Left upper quadrant discomfort, acute issue Symptoms started about a month ago.  Described as a "uncomfortable feeling."  Again, no nausea or vomiting.  No clear precipitating factors.  No clear aggravating or alleviating factors.  No early satiety.  Has not had symptoms in a couple of days.  GERD, chronic problem, new issue Several year history.  Worse with certain foods including alcohol, caffeine and fatty foods.  She takes Pepcid once daily which helps with her symptoms.  No early satiety.  No night sweats.  ROS: Per HPI, otherwise a 14 point review of systems was performed and was negative  PMH:  The following were reviewed and entered/updated in epic: Past Medical History:  Diagnosis Date  . Cancer ([place])   . Genital warts   . GERD (gastroesophageal reflux disease)    Patient Active Problem List   Diagnosis Date Noted  . GERD (gastroesophageal reflux disease) 09/17/2017  . Elevated blood pressure reading 09/17/2017   Past Surgical History:  Procedure Laterality Date  . APPENDECTOMY  1998  . CESAREAN SECTION  2008  . LYMPH NODE DISSECTION  2016  . OVARIAN CYST REMOVAL  1999  . ovarian cysts    . TONSILLECTOMY AND ADENOIDECTOMY  1980    Family History  Problem  Relation Age of Onset  . Hyperlipidemia Mother   . Cancer Mother        Breast  . Hypertension Mother   . Hyperlipidemia Father   . Hypertension Father   . Hearing loss Father   . Stroke Paternal Grandmother   . Birth defects Brother        CP  . Heart attack Maternal Grandmother     Medications- reviewed and updated Current Outpatient Medications  Medication Sig Dispense Refill  . famotidine (PEPCID) 20 MG tablet Take 20 mg by mouth daily as needed for heartburn or indigestion.    Marland Kitchen levonorgestrel (MIRENA) 20 MCG/24HR IUD 1 each by Intrauterine route once.     No current facility-administered medications for this visit.    Allergies-reviewed and updated No Known Allergies  Social History   Socioeconomic History  . Marital status: Married    Spouse name: None  . Number of children: None  . Years of education: None  . Highest education level: None  Social Needs  . Financial resource strain: None  . Food insecurity - worry: None  . Food insecurity - inability: None  . Transportation needs - medical: None  . Transportation needs - non-medical: None  Occupational History  . Occupation: Scientist, research (medical)  Tobacco Use  . Smoking status: Never Smoker  . Smokeless tobacco: Never Used  Substance and Sexual Activity  . Alcohol use: Yes  .  Drug use: No  . Sexual activity: Yes    Partners: Male  Other Topics Concern  . None  Social History Narrative   Has twins.   Objective:  Physical Exam: BP (!) 160/110   Pulse 90   Ht 5\' 3"  (1.6 m)   Wt 177 lb 9.6 oz (80.6 kg)   LMP 10/29/2006   SpO2 99%   BMI 31.46 kg/m   Gen: NAD, resting comfortably CV: RRR with no murmurs appreciated Pulm: NWOB, CTAB with no crackles, wheezes, or rhonchi GI: Normal bowel sounds present. Soft, Nontender, Nondistended.  Subtle mass noted in the lower abdomen, consistent with a large uterus.  No hernias appreciated. MSK: No edema, cyanosis, or clubbing noted.  No CVA tenderness. Skin: Warm,  dry Neuro: Grossly normal, moves all extremities Psych: Normal affect and thought content  Results for orders placed or performed in visit on 09/17/17 (from the past 72 hour(s))  POCT Urinalysis Dipstick (Automated)     Status: None   Collection Time: 09/17/17 10:40 AM  Result Value Ref Range   Color, UA Yellow    Clarity, UA Clear    Glucose, UA Negative    Bilirubin, UA Negative    Ketones, UA Negative    Spec Grav, UA 1.025 1.010 - 1.025   Blood, UA Negative    pH, UA 6.0 5.0 - 8.0   Protein, UA Negative    Urobilinogen, UA 0.2 0.2 or 1.0 E.U./dL   Nitrite, UA Negative    Leukocytes, UA Negative Negative   Assessment/Plan:  GERD (gastroesophageal reflux disease) Discussed role of dietary choices.  Continue Pepcid once or twice daily as needed to control symptoms.  Elevated blood pressure reading BP generally well controlled per patient.  Advised her to continue checking blood pressure at home when able over the next 1-2 weeks.  Let us know if persistently elevated above 140/90.  She will return in 1-2 weeks for blood pressure recheck.   Lower abdominal pain, acute issue No red flag signs or symptoms.  Her abdominal exam is benign.  She does seem to have an enlarged uterus-question fibroids.  Her urinalysis is normal.  She also has a history of several intra-abdominal operations-may have some scar tissue contributing.  Reassured patient discussed signs to watch out for including worsening pain, nausea, vomiting, constipation, and fevers.  Advised patient to follow-up with OB/GYN for evaluation for possible enlarged uterus.  Left upper quadrant pain, acute issue Again, no red flag signs or symptoms.  Her abdominal exam is benign.  No clear etiology for her symptoms-may be related to scar tissue/adhesions.  Discussed warning signs as noted above.  Preventative healthcare Flu shot given today.  Check lipid panel, CBC, and CMET.  Algis Greenhouse. Jerline Pain, MD 09/17/2017 11:56 AM

## 2017-09-17 NOTE — Assessment & Plan Note (Signed)
Discussed role of dietary choices.  Continue Pepcid once or twice daily as needed to control symptoms.

## 2017-09-17 NOTE — Patient Instructions (Addendum)
Talk to your OBGYN about your lower abdominal pain.  We can continue watching your upper abdominal pain - I do not feel a hernia currently.  You can continue using pecid once or twice daily as needed.  We will check blood work today.  Take care,  Dr Jerline Pain

## 2017-09-17 NOTE — Assessment & Plan Note (Signed)
BP generally well controlled per patient.  Advised her to continue checking blood pressure at home when able over the next 1-2 weeks.  Let us know if persistently elevated above 140/90.  She will return in 1-2 weeks for blood pressure recheck.

## 2017-09-18 ENCOUNTER — Telehealth: Payer: Self-pay | Admitting: Family Medicine

## 2017-09-18 NOTE — Telephone Encounter (Signed)
See results note. 

## 2017-09-18 NOTE — Telephone Encounter (Signed)
Patient returning missed call to Autumn to discuss lab results. Please call patient and advise.

## 2017-09-18 NOTE — Progress Notes (Signed)
Cholesterol is mildly elevated. Other labs normal. No need for medications at this time. Continue working on diet and exercise.  Can recheck in 1 year.  Algis Greenhouse. Jerline Pain, MD 09/18/2017 8:41 AM

## 2017-09-25 ENCOUNTER — Encounter: Payer: Self-pay | Admitting: Family Medicine

## 2017-10-09 DIAGNOSIS — Z01419 Encounter for gynecological examination (general) (routine) without abnormal findings: Secondary | ICD-10-CM | POA: Diagnosis not present

## 2018-02-25 ENCOUNTER — Ambulatory Visit: Payer: 59 | Admitting: Family Medicine

## 2018-02-25 ENCOUNTER — Encounter: Payer: Self-pay | Admitting: Family Medicine

## 2018-02-25 VITALS — BP 158/94 | HR 72 | Temp 97.7°F | Ht 64.0 in | Wt 170.0 lb

## 2018-02-25 DIAGNOSIS — I10 Essential (primary) hypertension: Secondary | ICD-10-CM | POA: Diagnosis not present

## 2018-02-25 DIAGNOSIS — M6740 Ganglion, unspecified site: Secondary | ICD-10-CM

## 2018-02-25 MED ORDER — AMLODIPINE BESYLATE 10 MG PO TABS: 10.0000 mg | ORAL_TABLET | Freq: Every day | ORAL | 11 refills | Status: DC

## 2018-02-25 NOTE — Assessment & Plan Note (Signed)
Elevated to 158/94 today. Has been persistently elevated for several months.  We will start amlodipine 10 mg daily today.  Discussed side effects of this medication and alternatives to treatment.  Also discussed lifestyle modifications including regular exercise and low-salt diet.  She will follow-up with me in 2 to 3 weeks.

## 2018-02-25 NOTE — Patient Instructions (Signed)
It was very nice to see you today.  Please start amlodipine 10mg  daily.  Your goal blood pressure is 140/90 or lower.  Please let me know if you have significant side effects to the amlodipine, or if your blood pressure continues to be elevated.  I think you possibly have a ganglion cyst on your toe.  This is benign.  Please let me know if this worsens or changes, but otherwise we can continue to keep an eye on it.  Please come back to see me in 2 to 3 weeks, or sooner as needed.  Take care, Dr. Jerline Pain   Ganglion Cyst A ganglion cyst is a noncancerous, fluid-filled lump that occurs near joints or tendons. The ganglion cyst grows out of a joint or the lining of a tendon. It most often develops in the hand or wrist, but it can also develop in the shoulder, elbow, hip, knee, ankle, or foot. The round or oval ganglion cyst can be the size of a pea or larger than a grape. Increased activity may enlarge the size of the cyst because more fluid starts to build up. What are the causes? It is not known what causes a ganglion cyst to grow. However, it may be related to:  Inflammation or irritation around the joint.  An injury.  Repetitive movements or overuse.  Arthritis.  What increases the risk? Risk factors include:  Being a woman.  Being age 50-50.  What are the signs or symptoms? Symptoms may include:  A lump. This most often appears on the hand or wrist, but it can occur in other areas of the body.  Tingling.  Pain.  Numbness.  Muscle weakness.  Weak grip.  Less movement in a joint.  How is this diagnosed? Ganglion cysts are most often diagnosed based on a physical exam. Your health care provider will feel the lump and may shine a light alongside it. If it is a ganglion cyst, a light often shines through it. Your health care provider may order an X-ray, ultrasound, or MRI to rule out other conditions. How is this treated? Ganglion cysts usually go away on their own  without treatment. If pain or other symptoms are involved, treatment may be needed. Treatment is also needed if the ganglion cyst limits your movement or if it gets infected. Treatment may include:  Wearing a brace or splint on your wrist or finger.  Taking anti-inflammatory medicine.  Draining fluid from the lump with a needle (aspiration).  Injecting a steroid into the joint.  Surgery to remove the ganglion cyst.  Follow these instructions at home:  Do not press on the ganglion cyst, poke it with a needle, or hit it.  Take medicines only as directed by your health care provider.  Wear your brace or splint as directed by your health care provider.  Watch your ganglion cyst for any changes.  Keep all follow-up visits as directed by your health care provider. This is important. Contact a health care provider if:  Your ganglion cyst becomes larger or more painful.  You have increased redness, red streaks, or swelling.  You have pus coming from the lump.  You have weakness or numbness in the affected area.  You have a fever or chills. This information is not intended to replace advice given to you by your health care provider. Make sure you discuss any questions you have with your health care provider. Document Released: 10/12/2000 Document Revised: 03/22/2016 Document Reviewed: 03/30/2014 Elsevier Interactive Patient Education  2018 [place] Eating Plan DASH stands for "Dietary Approaches to Stop Hypertension." The DASH eating plan is a healthy eating plan that has been shown to reduce high blood pressure (hypertension). It may also reduce your risk for type 2 diabetes, heart disease, and stroke. The DASH eating plan may also help with weight loss. What are tips for following this plan? General guidelines  Avoid eating more than 2,300 mg (milligrams) of salt (sodium) a day. If you have hypertension, you may need to reduce your sodium intake to 1,500 mg a  day.  Limit alcohol intake to no more than 1 drink a day for nonpregnant women and 2 drinks a day for men. One drink equals 12 oz of beer, 5 oz of wine, or 1 oz of hard liquor.  Work with your health care provider to maintain a healthy body weight or to lose weight. Ask what an ideal weight is for you.  Get at least 30 minutes of exercise that causes your heart to beat faster (aerobic exercise) most days of the week. Activities may include walking, swimming, or biking.  Work with your health care provider or diet and nutrition specialist (dietitian) to adjust your eating plan to your individual calorie needs. Reading food labels  Check food labels for the amount of sodium per serving. Choose foods with less than 5 percent of the Daily Value of sodium. Generally, foods with less than 300 mg of sodium per serving fit into this eating plan.  To find whole grains, look for the word "whole" as the first word in the ingredient list. Shopping  Buy products labeled as "low-sodium" or "no salt added."  Buy fresh foods. Avoid canned foods and premade or frozen meals. Cooking  Avoid adding salt when cooking. Use salt-free seasonings or herbs instead of table salt or sea salt. Check with your health care provider or pharmacist before using salt substitutes.  Do not fry foods. Cook foods using healthy methods such as baking, boiling, grilling, and broiling instead.  Cook with heart-healthy oils, such as olive, canola, soybean, or sunflower oil. Meal planning   Eat a balanced diet that includes: ? 5 or more servings of fruits and vegetables each day. At each meal, try to fill half of your plate with fruits and vegetables. ? Up to 6-8 servings of whole grains each day. ? Less than 6 oz of lean meat, poultry, or fish each day. A 3-oz serving of meat is about the same size as a deck of cards. One egg equals 1 oz. ? 2 servings of low-fat dairy each day. ? A serving of nuts, seeds, or beans 5 times  each week. ? Heart-healthy fats. Healthy fats called Omega-3 fatty acids are found in foods such as flaxseeds and coldwater fish, like sardines, salmon, and mackerel.  Limit how much you eat of the following: ? Canned or prepackaged foods. ? Food that is high in trans fat, such as fried foods. ? Food that is high in saturated fat, such as fatty meat. ? Sweets, desserts, sugary drinks, and other foods with added sugar. ? Full-fat dairy products.  Do not salt foods before eating.  Try to eat at least 2 vegetarian meals each week.  Eat more home-cooked food and less restaurant, buffet, and fast food.  When eating at a restaurant, ask that your food be prepared with less salt or no salt, if possible. What foods are recommended? The items listed may not be a complete list. Talk  with your dietitian about what dietary choices are best for you. Grains Whole-grain or whole-wheat bread. Whole-grain or whole-wheat pasta. Brown rice. [place]. Bulgur. Whole-grain and low-sodium cereals. Pita bread. Low-fat, low-sodium crackers. Whole-wheat flour tortillas. Vegetables Fresh or frozen vegetables (raw, steamed, roasted, or grilled). Low-sodium or reduced-sodium tomato and vegetable juice. Low-sodium or reduced-sodium tomato sauce and tomato paste. Low-sodium or reduced-sodium canned vegetables. Fruits All fresh, dried, or frozen fruit. Canned fruit in natural juice (without added sugar). Meat and other protein foods Skinless chicken or [place]. Ground chicken or [place]. Pork with fat trimmed off. Fish and seafood. Egg whites. Dried beans, peas, or lentils. Unsalted nuts, nut butters, and seeds. Unsalted canned beans. Lean cuts of beef with fat trimmed off. Low-sodium, lean deli meat. Dairy Low-fat (1%) or fat-free (skim) milk. Fat-free, low-fat, or reduced-fat cheeses. Nonfat, low-sodium ricotta or cottage cheese. Low-fat or nonfat yogurt. Low-fat, low-sodium cheese. Fats and oils Soft margarine  without trans fats. Vegetable oil. Low-fat, reduced-fat, or light mayonnaise and salad dressings (reduced-sodium). Canola, safflower, olive, soybean, and sunflower oils. Avocado. Seasoning and other foods Herbs. Spices. Seasoning mixes without salt. Unsalted popcorn and pretzels. Fat-free sweets. What foods are not recommended? The items listed may not be a complete list. Talk with your dietitian about what dietary choices are best for you. Grains Baked goods made with fat, such as croissants, muffins, or some breads. Dry pasta or rice meal packs. Vegetables Creamed or fried vegetables. Vegetables in a cheese sauce. Regular canned vegetables (not low-sodium or reduced-sodium). Regular canned tomato sauce and paste (not low-sodium or reduced-sodium). Regular tomato and vegetable juice (not low-sodium or reduced-sodium). Angie Fava. Olives. Fruits Canned fruit in a light or heavy syrup. Fried fruit. Fruit in cream or butter sauce. Meat and other protein foods Fatty cuts of meat. Ribs. Fried meat. Berniece Salines. Sausage. Bologna and other processed lunch meats. Salami. Fatback. Hotdogs. Bratwurst. Salted nuts and seeds. Canned beans with added salt. Canned or smoked fish. Whole eggs or egg yolks. Chicken or [place] with skin. Dairy Whole or 2% milk, cream, and half-and-half. Whole or full-fat cream cheese. Whole-fat or sweetened yogurt. Full-fat cheese. Nondairy creamers. Whipped toppings. Processed cheese and cheese spreads. Fats and oils Butter. Stick margarine. Lard. Shortening. Ghee. Bacon fat. Tropical oils, such as coconut, palm kernel, or palm oil. Seasoning and other foods Salted popcorn and pretzels. Onion salt, garlic salt, seasoned salt, table salt, and sea salt. Worcestershire sauce. Tartar sauce. Barbecue sauce. Teriyaki sauce. Soy sauce, including reduced-sodium. Steak sauce. Canned and packaged gravies. Fish sauce. Oyster sauce. Cocktail sauce. Horseradish that you find on the shelf. Ketchup.  Mustard. Meat flavorings and tenderizers. Bouillon cubes. Hot sauce and Tabasco sauce. Premade or packaged marinades. Premade or packaged taco seasonings. Relishes. Regular salad dressings. Where to find more information:  National Heart, Lung, and [place]: https://wilson-eaton.com/  American Heart Association: www.heart.org Summary  The DASH eating plan is a healthy eating plan that has been shown to reduce high blood pressure (hypertension). It may also reduce your risk for type 2 diabetes, heart disease, and stroke.  With the DASH eating plan, you should limit salt (sodium) intake to 2,300 mg a day. If you have hypertension, you may need to reduce your sodium intake to 1,500 mg a day.  When on the DASH eating plan, aim to eat more fresh fruits and vegetables, whole grains, lean proteins, low-fat dairy, and heart-healthy fats.  Work with your health care provider or diet and nutrition specialist (dietitian) to adjust your  eating plan to your individual calorie needs. This information is not intended to replace advice given to you by your health care provider. Make sure you discuss any questions you have with your health care provider. Document Released: 10/04/2011 Document Revised: 10/08/2016 Document Reviewed: 10/08/2016 Elsevier Interactive Patient Education  [employer].

## 2018-02-25 NOTE — Progress Notes (Signed)
   Subjective:  [name] [name] is a 47 y.o. female who presents today for same-day appointment with a chief complaint of elevated blood pressure reading.   HPI:  Elevated Blood Pressure Reading, established problem, uncontrolled Patient seen about 5 months ago for elevated blood pressure.  Since then, blood pressures continue to be elevated into the 150s to 160s over 90s range.  Blood pressures occasionally into the 180s range.  She has never been on any medications of her blood pressure.  Denies any chest pain or shortness of breath.  She has been trying to exercise more regularly.  She is also trying weight watchers to help her lose weight.  No leg swelling.  Toe Lump, new problem Patient first noticed a few days ago after dropping a can on her right big toe.  She then went to urgent care where she had an x-ray done that was negative for fracture.  She has had some mild amount of bruising and pain to the area however has noticed a small, mobile mass over her first MTP joint over the last few days.  She is not sure if this has been there in the past or not.  The lesion has been stable over the last few days.  She is not aware of any other obvious precipitating events.  No other obvious alleviating or aggravating factors.  ROS: Per HPI  PMH: She reports that she has never smoked. She has never used smokeless tobacco. She reports that she drinks alcohol. She reports that she does not use drugs.  Objective:  Physical Exam: BP (!) 158/94 (BP Location: Left Arm)   Pulse 72   Temp 97.7 F (36.5 C)   Ht 5\' 4"  (1.626 m)   Wt 170 lb (77.1 kg)   SpO2 100%   BMI 29.18 kg/m   Gen: NAD, resting comfortably CV: RRR with no murmurs appreciated Pulm: NWOB, CTAB with no crackles, wheezes, or rhonchi MSK: --Rightfoot: No gross deformities. Mild ecchymosis on dorsal aspect of first ray.  First MTP joint with small 5 mm lump just medial to the extensor tendons.  Nontender to  palpation.  Assessment/Plan:  Essential hypertension Elevated to 158/94 today. Has been persistently elevated for several months.  We will start amlodipine 10 mg daily today.  Discussed side effects of this medication and alternatives to treatment.  Also discussed lifestyle modifications including regular exercise and low-salt diet.  She will follow-up with me in 2 to 3 weeks.  Ganglion cyst Lump on patient's right great toe consistent with ganglion cyst.  It is possible that she has having mild localized tendon inflammation related to her recent trauma.  Discussed treatment options with patient.  We will continue with watchful waiting.  If becomes symptomatic or changes in characteristic, will need ultrasound to further characterize.  Algis Greenhouse. Jerline Pain, MD 02/25/2018 11:24 AM

## 2018-03-12 ENCOUNTER — Encounter: Payer: Self-pay | Admitting: Family Medicine

## 2018-03-12 ENCOUNTER — Ambulatory Visit: Payer: 59 | Admitting: Family Medicine

## 2018-03-12 DIAGNOSIS — Z0289 Encounter for other administrative examinations: Secondary | ICD-10-CM

## 2018-03-14 ENCOUNTER — Ambulatory Visit (INDEPENDENT_AMBULATORY_CARE_PROVIDER_SITE_OTHER): Payer: 59 | Admitting: Family Medicine

## 2018-03-14 ENCOUNTER — Encounter: Payer: Self-pay | Admitting: Family Medicine

## 2018-03-14 DIAGNOSIS — I10 Essential (primary) hypertension: Secondary | ICD-10-CM | POA: Diagnosis not present

## 2018-03-14 DIAGNOSIS — B001 Herpesviral vesicular dermatitis: Secondary | ICD-10-CM

## 2018-03-14 MED ORDER — VALACYCLOVIR HCL 1 G PO TABS: 2000.0000 mg | ORAL_TABLET | Freq: Two times a day (BID) | ORAL | 0 refills | Status: DC

## 2018-03-14 NOTE — Assessment & Plan Note (Signed)
At goal.  Continue amlodipine 10 mg daily.  Discussed lifestyle modifications and home blood pressure monitoring.  She will follow-up in about 6 months for her CPE.

## 2018-03-14 NOTE — Assessment & Plan Note (Signed)
Start Valtrex 2000 mg twice daily for 1 day for outbreaks.  Return precautions reviewed.  Follow-up as needed.

## 2018-03-14 NOTE — Progress Notes (Signed)
    Subjective:  [name] [name] is a 47 y.o. female who presents today with a chief complaint of hypertension follow up.   HPI:  Hypertension, established problem, Stable BP Readings from Last 3 Encounters:  03/14/18 120/80  02/25/18 (!) 158/94  09/17/17 (!) 160/110  Patient seen to 3 weeks ago for this.  Started on amlodipine 10 mg daily.  Tolerated this well without side effects.  ROS: No reported chest pain, shortness of breath, dyspnea on exertion, or leg edema.   Cold Sores, new problem Severe history.  Has current outbreak on her upper lip.  Will get frequent outbreaks on both her lips and occasionally into her nose.  Has tried Abreva in the past which works somewhat.  No obvious precipitating events.  No other obvious alleviating or aggravating factors.  ROS: Per HPI  PMH: She reports that she has never smoked. She has never used smokeless tobacco. She reports that she drinks alcohol. She reports that she does not use drugs.   Objective:  Physical Exam: BP 120/80 (BP Location: Left Arm, Patient Position: Sitting, Cuff Size: Normal)   Pulse 76   Temp 98.4 F (36.9 C) (Oral)   Resp 16   Ht 5\' 4"  (1.626 m)   Wt 171 lb 8 oz (77.8 kg)   SpO2 98%   BMI 29.44 kg/m   Gen: NAD, resting comfortably HEENT: Erythematous lesion noted on upper lip. CV: RRR with no murmurs appreciated Pulm: NWOB, CTAB with no crackles, wheezes, or rhonchi MSK: No edema, cyanosis, or clubbing noted  Assessment/Plan:  Essential hypertension At goal.  Continue amlodipine 10 mg daily.  Discussed lifestyle modifications and home blood pressure monitoring.  She will follow-up in about 6 months for her CPE.  Herpes labialis Start Valtrex 2000 mg twice daily for 1 day for outbreaks.  Return precautions reviewed.  Follow-up as needed.   Algis Greenhouse. Jerline Pain, MD 03/14/2018 10:43 AM

## 2018-06-23 DIAGNOSIS — Z803 Family history of malignant neoplasm of breast: Secondary | ICD-10-CM | POA: Diagnosis not present

## 2018-06-23 DIAGNOSIS — Z1231 Encounter for screening mammogram for malignant neoplasm of breast: Secondary | ICD-10-CM | POA: Diagnosis not present

## 2018-06-26 DIAGNOSIS — N6011 Diffuse cystic mastopathy of right breast: Secondary | ICD-10-CM | POA: Diagnosis not present

## 2018-08-18 DIAGNOSIS — D225 Melanocytic nevi of trunk: Secondary | ICD-10-CM | POA: Diagnosis not present

## 2018-08-18 DIAGNOSIS — L814 Other melanin hyperpigmentation: Secondary | ICD-10-CM | POA: Diagnosis not present

## 2018-08-18 DIAGNOSIS — L821 Other seborrheic keratosis: Secondary | ICD-10-CM | POA: Diagnosis not present

## 2018-09-10 DIAGNOSIS — J00 Acute nasopharyngitis [common cold]: Secondary | ICD-10-CM | POA: Diagnosis not present

## 2018-10-09 ENCOUNTER — Encounter: Payer: Self-pay | Admitting: Family Medicine

## 2018-10-09 ENCOUNTER — Ambulatory Visit: Payer: 59 | Admitting: Family Medicine

## 2018-10-09 VITALS — BP 124/80 | HR 84 | Temp 98.3°F | Ht 64.0 in | Wt 175.2 lb

## 2018-10-09 DIAGNOSIS — R059 Cough, unspecified: Secondary | ICD-10-CM

## 2018-10-09 DIAGNOSIS — M79646 Pain in unspecified finger(s): Secondary | ICD-10-CM | POA: Diagnosis not present

## 2018-10-09 DIAGNOSIS — R05 Cough: Secondary | ICD-10-CM | POA: Diagnosis not present

## 2018-10-09 DIAGNOSIS — J3489 Other specified disorders of nose and nasal sinuses: Secondary | ICD-10-CM

## 2018-10-09 MED ORDER — BENZONATATE 200 MG PO CAPS: 200.0000 mg | ORAL_CAPSULE | Freq: Two times a day (BID) | ORAL | 0 refills | Status: DC | PRN

## 2018-10-09 MED ORDER — METHYLPREDNISOLONE ACETATE 80 MG/ML IJ SUSP
80.0000 mg | Freq: Once | INTRAMUSCULAR | Status: AC
  Administered 2018-10-09: 80 mg via INTRAMUSCULAR

## 2018-10-09 MED ORDER — IPRATROPIUM BROMIDE 0.06 % NA SOLN: 2.0000 | Freq: Four times a day (QID) | NASAL | 0 refills | Status: DC

## 2018-10-09 NOTE — Progress Notes (Signed)
   Subjective:  [name] [name] is a 47 y.o. female who presents today for same-day appointment with a chief complaint of cough.   HPI:  Cough, Acute problem Started a couple of months ago.  Worsened over last 1 to 2 weeks.  Was diagnosed with a sinus infection about a month ago and was treated with Claritin and Flonase which have not helped significantly.  She has had some rhinorrhea in the morning.  No sore throat.  No fevers or chills.  No sputum production.  No other obvious alleviating or aggravating factors.  Thumb pain Chronic problem.  Several year history.  Located in bilateral thumbs.  Takes ibuprofen and Tylenol as needed which help.  ROS: Per HPI  PMH: She reports that she has never smoked. She has never used smokeless tobacco. She reports current alcohol use. She reports that she does not use drugs.  Objective:  Physical Exam: BP 124/80 (BP Location: Left Arm, Patient Position: Sitting, Cuff Size: Normal)   Pulse 84   Temp 98.3 F (36.8 C) (Oral)   Ht 5\' 4"  (1.626 m)   Wt 175 lb 4 oz (79.5 kg)   SpO2 99%   BMI 30.08 kg/m   Gen: NAD, resting comfortably HEENT: TMs clear.  OP clear.  Nose mucosa boggy and erythematous bilaterally. CV: RRR with no murmurs appreciated Pulm: NWOB, CTAB with no crackles, wheezes, or rhonchi MSK: -Hands: No deformities.  Tender palpation along first Blythe joint bilaterally.  Pain elicited with axial load.  Finkelstein test negative bilaterally.  Assessment/Plan:  Cough Likely postinfectious.  No signs of infection today.  Start Atrovent nasal spray for her rhinorrhea.  Will give 80 mg IM Depo-Medrol today.  Start Tessalon for cough.  Recommended honey as needed.  Encouraged good hydration.  Discussed reasons return to care and seek emergent care.  Bilateral thumb pain Likely secondary to [facility] osteoarthritis.  Will place referral to orthopedics per her request.  Hopefully will have some improvement with above Depo-Medrol injection noted  above.  Algis Greenhouse. Jerline Pain, MD 10/09/2018 11:16 AM

## 2018-10-09 NOTE — Patient Instructions (Signed)
Start the atrovent and tessalon.  We will give you an injection of steroids today.   Please stay well hydrated. You can use honey and over the counter meds as needed.  Please let me know if your symptoms worsen or fail to improve.  I will refer you to see Dr Amedeo Plenty.  Take care, Dr Jerline Pain

## 2018-11-18 ENCOUNTER — Other Ambulatory Visit: Payer: Self-pay | Admitting: Family Medicine

## 2018-12-08 DIAGNOSIS — M1812 Unilateral primary osteoarthritis of first carpometacarpal joint, left hand: Secondary | ICD-10-CM | POA: Diagnosis not present

## 2018-12-08 DIAGNOSIS — M79644 Pain in right finger(s): Secondary | ICD-10-CM | POA: Diagnosis not present

## 2018-12-08 DIAGNOSIS — M79645 Pain in left finger(s): Secondary | ICD-10-CM | POA: Diagnosis not present

## 2018-12-08 DIAGNOSIS — M1811 Unilateral primary osteoarthritis of first carpometacarpal joint, right hand: Secondary | ICD-10-CM | POA: Diagnosis not present

## 2018-12-17 ENCOUNTER — Other Ambulatory Visit: Payer: Self-pay | Admitting: Family Medicine

## 2019-03-02 ENCOUNTER — Other Ambulatory Visit: Payer: Self-pay | Admitting: Family Medicine

## 2019-03-19 DIAGNOSIS — Z01419 Encounter for gynecological examination (general) (routine) without abnormal findings: Secondary | ICD-10-CM | POA: Diagnosis not present

## 2019-03-19 DIAGNOSIS — Z6830 Body mass index (BMI) 30.0-30.9, adult: Secondary | ICD-10-CM | POA: Diagnosis not present

## 2019-06-01 ENCOUNTER — Other Ambulatory Visit: Payer: Self-pay

## 2019-06-01 DIAGNOSIS — Z20822 Contact with and (suspected) exposure to covid-19: Secondary | ICD-10-CM

## 2019-06-03 LAB — NOVEL CORONAVIRUS, NAA: SARS-CoV-2, NAA: NOT DETECTED

## 2019-06-30 ENCOUNTER — Other Ambulatory Visit: Payer: Self-pay | Admitting: Radiology

## 2019-06-30 DIAGNOSIS — Z803 Family history of malignant neoplasm of breast: Secondary | ICD-10-CM

## 2019-06-30 DIAGNOSIS — R922 Inconclusive mammogram: Secondary | ICD-10-CM

## 2019-07-13 ENCOUNTER — Other Ambulatory Visit: Payer: Self-pay

## 2019-07-13 ENCOUNTER — Ambulatory Visit
Admission: RE | Admit: 2019-07-13 | Discharge: 2019-07-13 | Disposition: A | Discharge status: Home / Self Care | Payer: Self-pay | Source: Ambulatory Visit | Attending: Radiology | Admitting: Radiology

## 2019-07-13 DIAGNOSIS — Z803 Family history of malignant neoplasm of breast: Secondary | ICD-10-CM

## 2019-07-13 DIAGNOSIS — R922 Inconclusive mammogram: Secondary | ICD-10-CM

## 2019-07-13 MED ORDER — GADOBUTROL 1 MMOL/ML IV SOLN
8.0000 mL | Freq: Once | INTRAVENOUS | Status: AC | PRN
  Administered 2019-07-13: 8 mL via INTRAVENOUS

## 2019-08-17 ENCOUNTER — Other Ambulatory Visit: Payer: Self-pay

## 2019-08-17 DIAGNOSIS — Z20822 Contact with and (suspected) exposure to covid-19: Secondary | ICD-10-CM

## 2019-08-19 LAB — NOVEL CORONAVIRUS, NAA: SARS-CoV-2, NAA: NOT DETECTED

## 2019-09-21 ENCOUNTER — Other Ambulatory Visit: Payer: Self-pay

## 2019-09-21 DIAGNOSIS — Z20822 Contact with and (suspected) exposure to covid-19: Secondary | ICD-10-CM

## 2019-09-22 LAB — NOVEL CORONAVIRUS, NAA: SARS-CoV-2, NAA: NOT DETECTED

## 2019-09-29 HISTORY — PX: HAND SURGERY: SHX662

## 2019-10-07 ENCOUNTER — Other Ambulatory Visit (HOSPITAL_COMMUNITY)
Admission: RE | Admit: 2019-10-07 | Discharge: 2019-10-07 | Disposition: A | Discharge status: Home / Self Care | Payer: No Typology Code available for payment source | Source: Ambulatory Visit | Attending: Orthopedic Surgery | Admitting: Orthopedic Surgery

## 2019-10-07 DIAGNOSIS — Z01812 Encounter for preprocedural laboratory examination: Secondary | ICD-10-CM | POA: Insufficient documentation

## 2019-10-07 DIAGNOSIS — Z20828 Contact with and (suspected) exposure to other viral communicable diseases: Secondary | ICD-10-CM | POA: Insufficient documentation

## 2019-10-07 LAB — SARS CORONAVIRUS 2 (TAT 6-24 HRS): SARS Coronavirus 2: NEGATIVE

## 2019-12-30 ENCOUNTER — Encounter: Payer: Self-pay | Admitting: Family Medicine

## 2020-01-06 ENCOUNTER — Other Ambulatory Visit: Payer: Self-pay | Admitting: Family Medicine

## 2020-02-11 ENCOUNTER — Telehealth (INDEPENDENT_AMBULATORY_CARE_PROVIDER_SITE_OTHER): Payer: 59 | Admitting: Family Medicine

## 2020-02-11 ENCOUNTER — Other Ambulatory Visit: Payer: Self-pay

## 2020-02-11 ENCOUNTER — Other Ambulatory Visit (INDEPENDENT_AMBULATORY_CARE_PROVIDER_SITE_OTHER): Payer: 59

## 2020-02-11 ENCOUNTER — Encounter: Payer: Self-pay | Admitting: Family Medicine

## 2020-02-11 DIAGNOSIS — I10 Essential (primary) hypertension: Secondary | ICD-10-CM | POA: Diagnosis not present

## 2020-02-11 DIAGNOSIS — R109 Unspecified abdominal pain: Secondary | ICD-10-CM

## 2020-02-11 DIAGNOSIS — K219 Gastro-esophageal reflux disease without esophagitis: Secondary | ICD-10-CM | POA: Diagnosis not present

## 2020-02-11 LAB — URINALYSIS, ROUTINE W REFLEX MICROSCOPIC
Bilirubin Urine: NEGATIVE
Hgb urine dipstick: NEGATIVE
Ketones, ur: 15 — AB
Leukocytes,Ua: NEGATIVE
Nitrite: NEGATIVE
RBC / HPF: NONE SEEN (ref 0–?)
Specific Gravity, Urine: 1.025 (ref 1.000–1.030)
Total Protein, Urine: NEGATIVE
Urine Glucose: NEGATIVE
Urobilinogen, UA: 0.2 (ref 0.0–1.0)
pH: 5.5 (ref 5.0–8.0)

## 2020-02-11 MED ORDER — DICLOFENAC SODIUM 75 MG PO TBEC: 75.0000 mg | DELAYED_RELEASE_TABLET | Freq: Two times a day (BID) | ORAL | 0 refills | Status: DC

## 2020-02-11 NOTE — Assessment & Plan Note (Signed)
Doing well with Pepcid 20 mg daily as needed.  Will need labs at next CPE.

## 2020-02-11 NOTE — Progress Notes (Signed)
   [name] [name] is a 49 y.o. female who presents today for a virtual office visit.  Assessment/Plan:  New/Acute Problems: Right flank pain No red flags.Discussed limitations of virtual visit and inability to perform physical exam.  Concern for nephrolithiasis.  Doubt UTI.  Patient will come in for UA and urine culture.  Also could represent muscle spasm.  Will start diclofenac 75 mg twice daily while we await urine lab results.  Chronic Problems Addressed Today: Essential hypertension Doing well with amlodipine 10 mg daily.  Will need to come in for CPE soon with blood work.  GERD (gastroesophageal reflux disease) Doing well with Pepcid 20 mg daily as needed.  Will need labs at next CPE.     Subjective:  HPI:  Patient with right back to right flank pain since last night.  Was very severe last night and then subsided.  Had small recurrence this morning.  No obvious precipitating events.  Feels like a cramping sensation.  She has increased pain in her back with urination but no dysuria.  No hematuria.  Has never had anything like this in the past.  No reported nausea or vomiting.  No reported fevers or chills.  She is otherwise doing well.       Objective/Observations  Physical Exam: Gen: NAD, resting comfortably Pulm: Normal work of breathing Neuro: Grossly normal, moves all extremities Psych: Normal affect and thought content  Virtual Visit via Video   I connected with [name] [name] on 02/11/20 at 11:20 AM EDT by a video enabled telemedicine application and verified that I am speaking with the correct person using two identifiers. The limitations of evaluation and management by telemedicine and the availability of in person appointments were discussed. The patient expressed understanding and agreed to proceed.   Patient location: Home Provider location: [place] participating in the virtual visit: Myself and Patient     Algis Greenhouse.  Jerline Pain, MD 02/11/2020 11:26 AM

## 2020-02-11 NOTE — Assessment & Plan Note (Signed)
Doing well with amlodipine 10 mg daily.  Will need to come in for CPE soon with blood work.

## 2020-02-12 LAB — URINE CULTURE
MICRO NUMBER:: 10368076
SPECIMEN QUALITY:: ADEQUATE

## 2020-02-15 ENCOUNTER — Telehealth: Payer: Self-pay

## 2020-02-15 NOTE — Telephone Encounter (Signed)
Patient returning missed call regarding labs

## 2020-02-15 NOTE — Telephone Encounter (Signed)
See Lab Report

## 2020-02-15 NOTE — Progress Notes (Signed)
Please inform patient of the following:  No signs of kidney stone or UTI. Would like for her to continuing taking the anti-inflammatory we prescribed and would like for her to let us know if her symptoms are not improving.'  [name] [name]. Jerline Pain, MD 02/15/2020 8:01 AM

## 2020-04-10 ENCOUNTER — Other Ambulatory Visit: Payer: Self-pay | Admitting: Family Medicine

## 2020-05-05 ENCOUNTER — Other Ambulatory Visit: Payer: Self-pay | Admitting: Family Medicine

## 2020-06-01 LAB — HM COLONOSCOPY

## 2020-06-10 ENCOUNTER — Other Ambulatory Visit: Payer: Self-pay | Admitting: Obstetrics and Gynecology

## 2020-06-15 ENCOUNTER — Other Ambulatory Visit: Payer: Self-pay

## 2020-06-15 ENCOUNTER — Encounter (HOSPITAL_BASED_OUTPATIENT_CLINIC_OR_DEPARTMENT_OTHER): Payer: Self-pay | Admitting: Obstetrics and Gynecology

## 2020-06-15 NOTE — Progress Notes (Signed)
Spoke w/ via phone for pre-op interview--- PT Lab needs dos---- Urine preg              Lab results------ pt getting CBC, BMP, T&S, EKG done 06-20-2020 @ 0930 COVID test ------ 06-20-2020 @ 9672 Arrive at ------- 0715 NPO after MN NO Solid Food.  Clear liquids from MN until--- 0615 Medications to take morning of surgery ----- Norvasc, Pepcid, Claritin w/ sips of water Diabetic medication ----- n/a Patient Special Instructions ----- n/a Pre-Op special Istructions ----- n/a Patient verbalized understanding of instructions that were given at this phone interview. Patient denies shortness of breath, chest pain, fever, cough at this phone interview.

## 2020-06-20 ENCOUNTER — Other Ambulatory Visit (HOSPITAL_COMMUNITY)
Admission: RE | Admit: 2020-06-20 | Discharge: 2020-06-20 | Disposition: A | Discharge status: Home / Self Care | Payer: 59 | Source: Ambulatory Visit | Attending: Obstetrics and Gynecology | Admitting: Obstetrics and Gynecology

## 2020-06-20 ENCOUNTER — Encounter (HOSPITAL_COMMUNITY)
Admission: RE | Admit: 2020-06-20 | Discharge: 2020-06-20 | Disposition: A | Discharge status: Home / Self Care | Payer: 59 | Source: Ambulatory Visit | Attending: Obstetrics and Gynecology | Admitting: Obstetrics and Gynecology

## 2020-06-20 ENCOUNTER — Other Ambulatory Visit: Payer: Self-pay

## 2020-06-20 DIAGNOSIS — Z01818 Encounter for other preprocedural examination: Secondary | ICD-10-CM | POA: Insufficient documentation

## 2020-06-20 DIAGNOSIS — Z20822 Contact with and (suspected) exposure to covid-19: Secondary | ICD-10-CM | POA: Diagnosis not present

## 2020-06-20 LAB — BASIC METABOLIC PANEL
Anion gap: 7 (ref 5–15)
BUN: 16 mg/dL (ref 6–20)
CO2: 26 mmol/L (ref 22–32)
Calcium: 9.4 mg/dL (ref 8.9–10.3)
Chloride: 103 mmol/L (ref 98–111)
Creatinine, Ser: 0.78 mg/dL (ref 0.44–1.00)
GFR calc Af Amer: >60 mL/min (ref >60)
GFR calc non Af Amer: >60 mL/min (ref >60)
Glucose, Bld: 95 mg/dL (ref 70–99)
Potassium: 4.1 mmol/L (ref 3.5–5.1)
Sodium: 136 mmol/L (ref 135–145)

## 2020-06-20 LAB — CBC
HCT: 39.1 % (ref 36.0–46.0)
Hemoglobin: 12.8 g/dL (ref 12.0–15.0)
MCH: 30.2 pg (ref 26.0–34.0)
MCHC: 32.7 g/dL (ref 30.0–36.0)
MCV: 92.2 fL (ref 80.0–100.0)
Platelets: 244 10*3/uL (ref 150–400)
RBC: 4.24 MIL/uL (ref 3.87–5.11)
RDW: 13.4 % (ref 11.5–15.5)
WBC: 4.2 10*3/uL (ref 4.0–10.5)
nRBC: 0 % (ref 0.0–0.2)

## 2020-06-20 LAB — SARS CORONAVIRUS 2 (TAT 6-24 HRS): SARS Coronavirus 2: NEGATIVE

## 2020-06-21 ENCOUNTER — Other Ambulatory Visit: Payer: Self-pay | Admitting: Obstetrics and Gynecology

## 2020-06-21 NOTE — H&P (Signed)
[name] [name] is an 49 y.o. female. History of fractured IUD for Diag HS and removal  Pertinent Gynecological History: Menses: flow is moderate Bleeding: dysfunctional uterine bleeding Contraception: post menopausal status DES exposure: denies Blood transfusions: none Sexually transmitted diseases: no past history Previous GYN Procedures: DNC  Last mammogram: normal Date: 2020 Last pap: normal Date: 2020 OB History: G1, P1   Menstrual History: Menarche age: 49 Patient's last menstrual period was 05/29/2020.    Past Medical History:  Diagnosis Date  . Arthritis   . Genital warts   . GERD (gastroesophageal reflux disease)   . Hypertension     Past Surgical History:  Procedure Laterality Date  . APPENDECTOMY  1998   AND OVARIAN CYSTECTOMY  . CESAREAN SECTION  10-15-2019   @[facility]    AND CLOSURE LARGE CYSTOTOMY  . DILATION AND CURETTAGE OF UTERUS  11-07-2007  @[facility]    w/ suction for retained placenta  . HAND SURGERY Right 09/2019   removal of bone for arthritis  . LAPAROTOMY W/ OVARIAN CYSTECTOMY  1999  . LYMPH NODE DISSECTION  2015 approx.   per pt upper chest area, stated benign  . OVARIAN CYST REMOVAL  1999  . TONSILLECTOMY AND ADENOIDECTOMY  1980    Family History  Problem Relation Age of Onset  . Hyperlipidemia Mother   . Cancer Mother        Breast  . Hypertension Mother   . Heart disease Mother   . Osteoarthritis Mother   . Hyperlipidemia Father   . Hypertension Father   . Hearing loss Father   . Gout Father   . Stroke Paternal Grandmother   . Birth defects Brother        CP  . Heart attack Maternal Grandmother   . Alcohol abuse Paternal Uncle     Social History:  reports that she quit smoking about 33 years ago. Her smoking use included cigarettes. She quit after 2.00 years of use. She has never used smokeless tobacco. She reports current alcohol use of about 21.0 standard drinks of alcohol per week. She reports that she does not use  drugs.  Allergies: No Known Allergies  No medications prior to admission.    Review of Systems  Constitutional: Negative.   All other systems reviewed and are negative.   Height 5\' 4"  (1.626 m), weight 78.5 kg, last menstrual period 05/29/2020. Physical Exam Constitutional:      Appearance: Normal appearance.  HENT:     Head: Normocephalic and atraumatic.  Cardiovascular:     Rate and Rhythm: Normal rate and regular rhythm.     Heart sounds: Normal heart sounds.  Pulmonary:     Effort: Pulmonary effort is normal.     Breath sounds: Normal breath sounds.  Abdominal:     Palpations: Abdomen is soft.  Genitourinary:    General: Normal vulva.  Musculoskeletal:        General: Normal range of motion.     Cervical back: Normal range of motion and neck supple.  Skin:    General: Skin is warm.  Neurological:     General: No focal deficit present.     Mental Status: She is alert.     No results found for this or any previous visit (from the past 24 hour(s)).  No results found.  Assessment/Plan: Fractured IUD for Diag HS and removal Surgical consent done  Timothea Bodenheimer J 06/21/2020, 11:29 PM

## 2020-06-22 ENCOUNTER — Encounter (HOSPITAL_BASED_OUTPATIENT_CLINIC_OR_DEPARTMENT_OTHER): Payer: Self-pay | Admitting: Obstetrics and Gynecology

## 2020-06-22 ENCOUNTER — Other Ambulatory Visit: Payer: Self-pay

## 2020-06-22 ENCOUNTER — Ambulatory Visit (HOSPITAL_BASED_OUTPATIENT_CLINIC_OR_DEPARTMENT_OTHER): Payer: 59 | Admitting: Anesthesiology

## 2020-06-22 ENCOUNTER — Encounter (HOSPITAL_BASED_OUTPATIENT_CLINIC_OR_DEPARTMENT_OTHER)
Admission: RE | Disposition: A | Discharge status: Home / Self Care | Payer: Self-pay | Source: Home / Self Care | Attending: Obstetrics and Gynecology

## 2020-06-22 ENCOUNTER — Ambulatory Visit (HOSPITAL_BASED_OUTPATIENT_CLINIC_OR_DEPARTMENT_OTHER)
Admission: RE | Admit: 2020-06-22 | Discharge: 2020-06-22 | Disposition: A | Discharge status: Home / Self Care | Payer: 59 | Attending: Obstetrics and Gynecology | Admitting: Obstetrics and Gynecology

## 2020-06-22 DIAGNOSIS — Y762 Prosthetic and other implants, materials and accessory obstetric and gynecological devices associated with adverse incidents: Secondary | ICD-10-CM | POA: Diagnosis not present

## 2020-06-22 DIAGNOSIS — T8332XA Displacement of intrauterine contraceptive device, initial encounter: Secondary | ICD-10-CM | POA: Insufficient documentation

## 2020-06-22 DIAGNOSIS — Z87891 Personal history of nicotine dependence: Secondary | ICD-10-CM | POA: Insufficient documentation

## 2020-06-22 DIAGNOSIS — I10 Essential (primary) hypertension: Secondary | ICD-10-CM | POA: Diagnosis not present

## 2020-06-22 DIAGNOSIS — M1990 Unspecified osteoarthritis, unspecified site: Secondary | ICD-10-CM | POA: Diagnosis not present

## 2020-06-22 HISTORY — DX: Unspecified osteoarthritis, unspecified site: M19.90

## 2020-06-22 HISTORY — DX: Essential (primary) hypertension: I10

## 2020-06-22 HISTORY — PX: HYSTEROSCOPY: SHX211

## 2020-06-22 LAB — POCT PREGNANCY, URINE: Preg Test, Ur: NEGATIVE

## 2020-06-22 LAB — TYPE AND SCREEN
ABO/RH(D): O POS
Antibody Screen: NEGATIVE

## 2020-06-22 SURGERY — HYSTEROSCOPY
Anesthesia: General | Site: Uterus

## 2020-06-22 MED ORDER — ONDANSETRON HCL 4 MG/2ML IJ SOLN
INTRAMUSCULAR | Status: AC
  Filled 2020-06-22: qty 2

## 2020-06-22 MED ORDER — FENTANYL CITRATE (PF) 100 MCG/2ML IJ SOLN
INTRAMUSCULAR | Status: DC | PRN
Start: 2020-06-22 — End: 2020-06-22
  Administered 2020-06-22: 25 ug via INTRAVENOUS
  Administered 2020-06-22: 50 ug via INTRAVENOUS
  Administered 2020-06-22: 25 ug via INTRAVENOUS

## 2020-06-22 MED ORDER — CEFAZOLIN SODIUM-DEXTROSE 2-4 GM/100ML-% IV SOLN
INTRAVENOUS | Status: AC
  Filled 2020-06-22: qty 100

## 2020-06-22 MED ORDER — IBUPROFEN 800 MG PO TABS: 800.0000 mg | ORAL_TABLET | Freq: Three times a day (TID) | ORAL | 0 refills | Status: AC | PRN

## 2020-06-22 MED ORDER — ACETAMINOPHEN 500 MG PO TABS
1000.0000 mg | ORAL_TABLET | Freq: Once | ORAL | Status: AC
  Administered 2020-06-22: 1000 mg via ORAL

## 2020-06-22 MED ORDER — KETOROLAC TROMETHAMINE 30 MG/ML IJ SOLN
INTRAMUSCULAR | Status: AC
  Filled 2020-06-22: qty 1

## 2020-06-22 MED ORDER — LIDOCAINE 2% (20 MG/ML) 5 ML SYRINGE
INTRAMUSCULAR | Status: AC
  Filled 2020-06-22: qty 5

## 2020-06-22 MED ORDER — PROPOFOL 10 MG/ML IV BOLUS
INTRAVENOUS | Status: AC
  Filled 2020-06-22: qty 20

## 2020-06-22 MED ORDER — FENTANYL CITRATE (PF) 100 MCG/2ML IJ SOLN
INTRAMUSCULAR | Status: AC
  Filled 2020-06-22: qty 2

## 2020-06-22 MED ORDER — FENTANYL CITRATE (PF) 100 MCG/2ML IJ SOLN: 25.0000 ug | INTRAMUSCULAR | Status: DC | PRN

## 2020-06-22 MED ORDER — ONDANSETRON HCL 4 MG/2ML IJ SOLN
INTRAMUSCULAR | Status: DC | PRN
  Administered 2020-06-22: 4 mg via INTRAVENOUS

## 2020-06-22 MED ORDER — SODIUM CHLORIDE 0.9 % IR SOLN
Status: DC | PRN
  Administered 2020-06-22: 3000 mL

## 2020-06-22 MED ORDER — MIDAZOLAM HCL 5 MG/5ML IJ SOLN
INTRAMUSCULAR | Status: DC | PRN
  Administered 2020-06-22: 2 mg via INTRAVENOUS

## 2020-06-22 MED ORDER — DEXAMETHASONE SODIUM PHOSPHATE 10 MG/ML IJ SOLN
INTRAMUSCULAR | Status: AC
  Filled 2020-06-22: qty 1

## 2020-06-22 MED ORDER — LACTATED RINGERS IV SOLN
INTRAVENOUS | Status: DC
  Administered 2020-06-22: 50 mL via INTRAVENOUS

## 2020-06-22 MED ORDER — PROPOFOL 10 MG/ML IV BOLUS
INTRAVENOUS | Status: DC | PRN
  Administered 2020-06-22: 180 mg via INTRAVENOUS
  Administered 2020-06-22: 20 mg via INTRAVENOUS

## 2020-06-22 MED ORDER — POVIDONE-IODINE 10 % EX SWAB: 2.0000 "application " | Freq: Once | CUTANEOUS | Status: DC

## 2020-06-22 MED ORDER — MIDAZOLAM HCL 2 MG/2ML IJ SOLN
INTRAMUSCULAR | Status: AC
  Filled 2020-06-22: qty 2

## 2020-06-22 MED ORDER — DEXAMETHASONE SODIUM PHOSPHATE 10 MG/ML IJ SOLN
INTRAMUSCULAR | Status: DC | PRN
  Administered 2020-06-22: 10 mg via INTRAVENOUS

## 2020-06-22 MED ORDER — CEFAZOLIN SODIUM-DEXTROSE 2-4 GM/100ML-% IV SOLN
2.0000 g | INTRAVENOUS | Status: AC
  Administered 2020-06-22: 2 g via INTRAVENOUS

## 2020-06-22 MED ORDER — LIDOCAINE 2% (20 MG/ML) 5 ML SYRINGE
INTRAMUSCULAR | Status: DC | PRN
  Administered 2020-06-22: 80 mg via INTRAVENOUS

## 2020-06-22 MED ORDER — ACETAMINOPHEN 500 MG PO TABS
ORAL_TABLET | ORAL | Status: AC
  Filled 2020-06-22: qty 2

## 2020-06-22 MED ORDER — VASOPRESSIN 20 UNIT/ML IV SOLN
INTRAVENOUS | Status: DC | PRN
  Administered 2020-06-22: 20 mL via INTRAMUSCULAR

## 2020-06-22 MED ORDER — BUPIVACAINE HCL (PF) 0.25 % IJ SOLN
INTRAMUSCULAR | Status: DC | PRN
  Administered 2020-06-22: 20 mL

## 2020-06-22 MED ORDER — KETOROLAC TROMETHAMINE 30 MG/ML IJ SOLN
INTRAMUSCULAR | Status: DC | PRN
  Administered 2020-06-22: 30 mg via INTRAVENOUS

## 2020-06-22 SURGICAL SUPPLY — 15 items
CATH ROBINSON RED A/P 16FR (CATHETERS) ×3 IMPLANT
COVER WAND RF STERILE (DRAPES) ×3 IMPLANT
ELECT REM PT RETURN 9FT ADLT (ELECTROSURGICAL)
ELECTRODE REM PT RTRN 9FT ADLT (ELECTROSURGICAL) IMPLANT
GLOVE BIO SURGEON STRL SZ7.5 (GLOVE) ×3 IMPLANT
GLOVE BIOGEL PI IND STRL 7.0 (GLOVE) ×2 IMPLANT
GLOVE BIOGEL PI INDICATOR 7.0 (GLOVE) ×1
GOWN STRL REUS W/ TWL LRG LVL3 (GOWN DISPOSABLE) ×6 IMPLANT
GOWN STRL REUS W/TWL LRG LVL3 (GOWN DISPOSABLE) ×9
HIBICLENS CHG 4% 4OZ (MISCELLANEOUS) ×3 IMPLANT
IV NS IRRIG 3000ML ARTHROMATIC (IV SOLUTION) ×3 IMPLANT
NS IRRIG 1000ML POUR BTL (IV SOLUTION) ×3 IMPLANT
PACK VAGINAL MINOR WOMEN LF (CUSTOM PROCEDURE TRAY) ×3 IMPLANT
PAD OB MATERNITY 4.3X12.25 (PERSONAL CARE ITEMS) ×3 IMPLANT
TOWEL OR 17X26 10 PK STRL BLUE (TOWEL DISPOSABLE) ×3 IMPLANT

## 2020-06-22 NOTE — Discharge Instructions (Signed)
DISCHARGE INSTRUCTIONS: HYSTEROSCOPY  The following instructions have been prepared to help you care for yourself upon your return home.    May take Ibuprofen after 3:30pm   Personal hygiene: Marland Kitchen Use sanitary pads for vaginal drainage, not tampons. . Shower the day after your procedure. . NO tub baths, pools or Jacuzzis for 2-3 weeks. . Wipe front to back after using the bathroom.  Activity and limitations: . Do NOT drive or operate any equipment for 24 hours. The effects of anesthesia are still present and drowsiness may result. . Do NOT rest in bed all day. . Walking is encouraged. . Walk up and down stairs slowly. . You may resume your normal activity in one to two days or as indicated by your physician. Sexual activity: NO intercourse for at least 2 weeks after the procedure, or as indicated by your Doctor.  Diet: Eat a light meal as desired this evening. You may resume your usual diet tomorrow.  Return to Work: You may resume your work activities in one to two days or as indicated by Marine scientist.  What to expect after your surgery: Expect to have vaginal bleeding/discharge for 2-3 days and spotting for up to 10 days. It is not unusual to have soreness for up to 1-2 weeks. You may have a slight burning sensation when you urinate for the first day. Mild cramps may continue for a couple of days. You may have a regular period in 2-6 weeks.  Call your doctor for any of the following: . Excessive vaginal bleeding or clotting, saturating and changing one pad every hour. . Inability to urinate 6 hours after discharge from hospital. . Pain not relieved by pain medication. . Fever of 100.4 F or greater. . Unusual vaginal discharge or odor.  Return to office _________________Call for an appointment ___________________ Patient's signature: ______________________ Nurse's signature ________________________     Post Anesthesia Home Care Instructions  Activity: Get plenty of  rest for the remainder of the day. A responsible individual must stay with you for 24 hours following the procedure.  For the next 24 hours, DO NOT: -Drive a car -Paediatric nurse -Drink alcoholic beverages -Take any medication unless instructed by your physician -Make any legal decisions or sign important papers.  Meals: Start with liquid foods such as gelatin or soup. Progress to regular foods as tolerated. Avoid greasy, spicy, heavy foods. If nausea and/or vomiting occur, drink only clear liquids until the nausea and/or vomiting subsides. Call your physician if vomiting continues.  Special Instructions/Symptoms: Your throat may feel dry or sore from the anesthesia or the breathing tube placed in your throat during surgery. If this causes discomfort, gargle with warm salt water. The discomfort should disappear within 24 hours.  If you had a scopolamine patch placed behind your ear for the management of post- operative nausea and/or vomiting:  1. The medication in the patch is effective for 72 hours, after which it should be removed.  Wrap patch in a tissue and discard in the trash. Wash hands thoroughly with soap and water. 2. You may remove the patch earlier than 72 hours if you experience unpleasant side effects which may include dry mouth, dizziness or visual disturbances. 3. Avoid touching the patch. Wash your hands with soap and water after contact with the patch.

## 2020-06-22 NOTE — Transfer of Care (Signed)
Immediate Anesthesia Transfer of Care Note  Patient: Song Myre [facility]  Procedure(s) Performed: HYSTEROSCOPY (N/A Uterus)  Patient Location: PACU  Anesthesia Type:General  Level of Consciousness: awake, alert  and oriented  Airway & Oxygen Therapy: Patient Spontanous Breathing and Patient connected to nasal cannula oxygen  Post-op Assessment: Report given to RN and Post -op Vital signs reviewed and stable  Post vital signs: Reviewed and stable  Last Vitals:  Vitals Value Taken Time  BP 115/76 06/22/20 0937  Temp    Pulse 83 06/22/20 0939  Resp 14 06/22/20 0939  SpO2 95 % 06/22/20 0939  Vitals shown include unvalidated device data.  Last Pain:  Vitals:   06/22/20 0802  TempSrc: Oral  PainSc: 0-No pain      Patients Stated Pain Goal: 5 (54/86/28 2417)  Complications: No complications documented.

## 2020-06-22 NOTE — Progress Notes (Signed)
Patient seen and examined. Consent witnessed and signed. No changes noted. Update completed. BP 131/86   Pulse 81   Temp 98.6 F (37 C) (Oral)   Resp 15   Ht 5\' 4"  (1.626 m)   Wt 77.9 kg   LMP 05/29/2020   SpO2 98%   BMI 29.47 kg/m   CBC    Component Value Date/Time   WBC 4.2 06/20/2020 1018   RBC 4.24 06/20/2020 1018   HGB 12.8 06/20/2020 1018   HCT 39.1 06/20/2020 1018   PLT 244 06/20/2020 1018   MCV 92.2 06/20/2020 1018   MCH 30.2 06/20/2020 1018   MCHC 32.7 06/20/2020 1018   RDW 13.4 06/20/2020 1018   LYMPHSABS 1.3 11/07/2007 1251   MONOABS 0.6 11/07/2007 1251   EOSABS 0.1 11/07/2007 1251   BASOSABS 0.0 11/07/2007 1251

## 2020-06-22 NOTE — Op Note (Signed)
NAMESHADE, RIVENBARK MEDICAL RECORD [id number] ACCOUNT [id number] DATE OF BIRTH:April 06, 1971 FACILITY: [facility] LOCATION: [facility] PHYSICIAN:Havish Petties J. Sybel Standish, MD  OPERATIVE REPORT  DATE OF PROCEDURE:  06/22/2020  PREOPERATIVE DIAGNOSIS:  History of lost intrauterine device with fractured intrauterine device upon removal in office.  POSTOPERATIVE DIAGNOSIS:  No evidence of intrauterine device.  PROCEDURE:  Diagnostic hysteroscopy.  SURGEON:  Brien Few, MD  ASSISTANT:  None.  ANESTHESIA:  Local, general.  ESTIMATED BLOOD LOSS:  Minimal.  COMPLICATIONS:  None.  DRAINS:  None.  COUNTS:  Correct.  DISPOSITION:  The patient was taken to recovery in good condition.  BRIEF OPERATIVE NOTE:  After being apprised of the risks of anesthesia, infection, bleeding, injury to surrounding organs, possible need for repair, delayed versus immediate complications including bowel and bladder injury with possible need for repair,  patient was brought to the operating room where she was administered general anesthetic without complications.  Prepped and draped in usual sterile fashion.  Feet are placed in [place].  Exam under anesthesia reveals a small anteflexed uterus  and no adnexal masses.  At this time, dilute Pitressin solution placed at 3 and 9 o'clock at the cervicovaginal junction.  Dilute paracervical block with Marcaine solution placed 20 mL total in standard fashion.  Cervix easily dilated up to 21 Pratt  dilator.  Hysteroscope placed.  Visualization reveals normal endometrial cavity.  Atrophic endometrium.  No evidence of uterine perforation.  Bilateral normal tubal ostia noted.  No evidence of IUD at this time.  At this time, due to no evidence of  retained IUD, procedure was terminated.  The patient is awakened and transferred to recovery in good condition.  CN/NUANCE  D:06/22/2020 T:06/22/2020 JOB:[id number]

## 2020-06-22 NOTE — Anesthesia Procedure Notes (Signed)
Procedure Name: LMA Insertion Date/Time: 06/22/2020 9:05 AM Performed by: Marysue Fait D, CRNA Pre-anesthesia Checklist: Patient identified, Emergency Drugs available, Suction available and Patient being monitored Patient Re-evaluated:Patient Re-evaluated prior to induction Oxygen Delivery Method: Circle system utilized Preoxygenation: Pre-oxygenation with 100% oxygen Induction Type: IV induction Ventilation: Mask ventilation without difficulty LMA: LMA inserted LMA Size: 4.0 Tube type: Oral Number of attempts: 1 Placement Confirmation: positive ETCO2 and breath sounds checked- equal and bilateral Tube secured with: Tape Dental Injury: Teeth and Oropharynx as per pre-operative assessment

## 2020-06-22 NOTE — Anesthesia Preprocedure Evaluation (Addendum)
Anesthesia Evaluation  Patient identified by MRN, date of birth, ID band Patient awake    Reviewed: Allergy & Precautions, NPO status , Patient's Chart, lab work & pertinent test results  Airway Mallampati: II  TM Distance: >3 FB Neck ROM: Full    Dental  (+) Chipped, Dental Advisory Given,    Pulmonary neg pulmonary ROS, former smoker,    Pulmonary exam normal breath sounds clear to auscultation       Cardiovascular hypertension, Pt. on medications negative cardio ROS Normal cardiovascular exam Rhythm:Regular Rate:Normal     Neuro/Psych negative neurological ROS  negative psych ROS   GI/Hepatic GERD  ,(+)     substance abuse  alcohol use,   Endo/Other  negative endocrine ROS  Renal/GU negative Renal ROS  negative genitourinary   Musculoskeletal  (+) Arthritis ,   Abdominal   Peds  Hematology negative hematology ROS (+)   Anesthesia Other Findings   Reproductive/Obstetrics                            Anesthesia Physical Anesthesia Plan  ASA: II  Anesthesia Plan: General   Post-op Pain Management:    Induction: Intravenous  PONV Risk Score and Plan: 3 and Ondansetron, Dexamethasone and Midazolam  Airway Management Planned: LMA  Additional Equipment:   Intra-op Plan:   Post-operative Plan: Extubation in OR  Informed Consent: I have reviewed the patients History and Physical, chart, labs and discussed the procedure including the risks, benefits and alternatives for the proposed anesthesia with the patient or authorized representative who has indicated his/her understanding and acceptance.     Dental advisory given  Plan Discussed with: CRNA  Anesthesia Plan Comments:         Anesthesia Quick Evaluation

## 2020-06-22 NOTE — Op Note (Signed)
06/22/2020  9:32 AM  PATIENT:  [name] [name]  49 y.o. female  PRE-OPERATIVE DIAGNOSIS:  Lost IUD  POST-OPERATIVE DIAGNOSIS:  Lost IUD  PROCEDURE:  Procedure(s): HYSTEROSCOPY  SURGEON:  Surgeon(s): Brien Few, MD  ASSISTANTS: none   ANESTHESIA:   local and general  ESTIMATED BLOOD LOSS: minimal   DRAINS: none   LOCAL MEDICATIONS USED:  MARCAINE     SPECIMEN:  Source of Specimen:  none  DISPOSITION OF SPECIMEN:  N/A  COUNTS:  YES  DICTATION #: [id number]  PLAN OF CARE: dc home  PATIENT DISPOSITION:  PACU - hemodynamically stable.

## 2020-06-22 NOTE — Anesthesia Postprocedure Evaluation (Signed)
Anesthesia Post Note  Patient: [name] [name] [facility]  Procedure(s) Performed: HYSTEROSCOPY (N/A Uterus)     Patient location during evaluation: PACU Anesthesia Type: General Level of consciousness: awake and alert Pain management: pain level controlled Vital Signs Assessment: post-procedure vital signs reviewed and stable Respiratory status: spontaneous breathing, nonlabored ventilation, respiratory function stable and patient connected to nasal cannula oxygen Cardiovascular status: blood pressure returned to baseline and stable Postop Assessment: no apparent nausea or vomiting Anesthetic complications: no   No complications documented.  Last Vitals:  Vitals:   06/22/20 1000 06/22/20 1015  BP: 108/79 101/75  Pulse: (!) 58 (!) 51  Resp: 14 13  Temp:    SpO2: 92% 94%    Last Pain:  Vitals:   06/22/20 1000  TempSrc:   PainSc: 0-No pain                 [name] [name]

## 2020-06-23 ENCOUNTER — Encounter (HOSPITAL_BASED_OUTPATIENT_CLINIC_OR_DEPARTMENT_OTHER): Payer: Self-pay | Admitting: Obstetrics and Gynecology

## 2020-08-07 ENCOUNTER — Other Ambulatory Visit: Payer: Self-pay | Admitting: Family Medicine

## 2020-10-19 ENCOUNTER — Encounter: Payer: No Typology Code available for payment source | Admitting: Hematology and Oncology

## 2020-11-09 NOTE — Progress Notes (Signed)
[place]  [phone] Fax:(336) [phone]     ID: [name] [name] DOB: 20-May-1971  MR#: [id number]  JYN#:[id number]  Patient Care Team: Vivi Barrack, MD as PCP - General (Family Medicine) Brien Few, MD as Consulting Physician (Obstetrics and Gynecology) Chauncey Cruel, MD OTHER MD:  CHIEF COMPLAINT: At high risk for breast cancer  CURRENT TREATMENT: Tamoxifen; intensified screening   HISTORY OF CURRENT ILLNESS: [name] [name] has a strong family history of breast cancer.  This was noted by Dr.Bertrand at the time of mammography and the patient was referred for further evaluation and treatment.  She had an abbreviated breast MRI on 07/13/2019 showing breast density C.  There were no masses or abnormal areas of enhancement and no evidence of adenopathy.  The patient's subsequent history is as detailed below.   INTERVAL HISTORY: [name] "[name]" was evaluated in the high risk breast cancer clinic on 11/10/2020    REVIEW OF SYSTEMS: The patient denies unusual headaches, visual changes, nausea, vomiting, stiff neck, dizziness, or gait imbalance. There has been no cough, phlegm production, or pleurisy, no chest pain or pressure, and no change in bowel or bladder habits. The patient denies fever, rash, bleeding, unexplained fatigue or unexplained weight loss.  She does not exercise regularly.  A detailed review of systems was otherwise entirely negative.   COVID 19 VACCINATION STATUS: Status post [employer] x2 with booster November 2021   PAST MEDICAL HISTORY: Past Medical History:  Diagnosis Date  . Arthritis   . Genital warts   . GERD (gastroesophageal reflux disease)   . Hypertension     PAST SURGICAL HISTORY: Past Surgical History:  Procedure Laterality Date  . APPENDECTOMY  1998   AND OVARIAN CYSTECTOMY  . CESAREAN SECTION  10-15-2019   @[facility]    AND CLOSURE LARGE CYSTOTOMY  . DILATION AND CURETTAGE OF UTERUS  11-07-2007  @[facility]     w/ suction for retained placenta  . HAND SURGERY Right 09/2019   removal of bone for arthritis  . HYSTEROSCOPY N/A 06/22/2020   Procedure: HYSTEROSCOPY;  Surgeon: Brien Few, MD;  Location: [facility];  Service: Gynecology;  Laterality: N/A;  . LAPAROTOMY W/ OVARIAN CYSTECTOMY  1999  . LYMPH NODE DISSECTION  2015 approx.   per pt upper chest area, stated benign  . OVARIAN CYST REMOVAL  1999  . TONSILLECTOMY AND ADENOIDECTOMY  1980    FAMILY HISTORY: Family History  Problem Relation Age of Onset  . Hyperlipidemia Mother   . Cancer Mother        Breast  . Hypertension Mother   . Heart disease Mother   . Osteoarthritis Mother   . Hyperlipidemia Father   . Hypertension Father   . Hearing loss Father   . Gout Father   . Stroke Paternal Grandmother   . Birth defects Brother        CP  . Heart attack Maternal Grandmother   . Alcohol abuse Paternal Uncle   As of January 2022 the patient's father is 83 years old.  The patient has 2 first cousins on the side of the family both with breast cancer in their 24s.  The patient's mother is 39 years old as of January 2022.  She was diagnosed with breast cancer at age 32.  She had 2 sisters who developed breast cancer and died by age 3.  The patient is not aware of any ovarian, pancreatic, or prostate cancer in the family.  She is not aware  of any uterine or colon cancers in the family  GYNECOLOGIC HISTORY:  No LMP recorded. (Menstrual status: IUD). Menarche: 50 years old Age at first live birth: 50 years old [place] P twins LMP age 72 Contraceptive stopped Mirena 2021, remote oral contraceptives HRT no  Hysterectomy? no BSO? 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  She graduated from [employer] and child development.  Her husband [name] [name] (goes by [name]") goes for [employer].  Their twins are 52 years old.  Family  attends [employer]    ADVANCED DIRECTIVES: In the absence of any documents to the contrary the patient's husband is her healthcare power of attorney   HEALTH MAINTENANCE: Social History   Tobacco Use  . Smoking status: Former Smoker    Years: 2.00    Types: Cigarettes    Quit date: 06/16/1987    Years since quitting: 33.4  . Smokeless tobacco: Never Used  Vaping Use  . Vaping Use: Never used  Substance Use Topics  . Alcohol use: Yes    Alcohol/week: 21.0 standard drinks    Types: 21 Glasses of wine per week    Comment: per pt 3 wine daily  . Drug use: Never     Colonoscopy:   PAP: 2020, negative  Bone density:    No Known Allergies  Current Outpatient Medications  Medication Sig Dispense Refill  . amLODipine (NORVASC) 10 MG tablet TAKE 1 TABLET BY MOUTH EVERY DAY 90 tablet 3  . famotidine (PEPCID) 20 MG tablet Take 20 mg by mouth daily as needed for heartburn or indigestion.    Marland Kitchen ibuprofen (ADVIL) 800 MG tablet Take 1 tablet (800 mg total) by mouth every 8 (eight) hours as needed. 30 tablet 0  . loratadine (CLARITIN) 10 MG tablet Take 10 mg by mouth daily.    . valACYclovir (VALTREX) 1000 MG tablet TAKE 2 TABLETS BY MOUTH EVERY 12 (TWELVE) HOURS. TAKE FOR 1 DAY 30 tablet 0   No current facility-administered medications for this visit.    OBJECTIVE: [name] who appears stated age  50:   11/10/20 1610  BP: 131/69  Pulse: 89  Resp: 17  Temp: 99 F (37.2 C)  SpO2: 99%     Body mass index is 31.07 kg/m.   Wt Readings from Last 3 Encounters:  11/10/20 181 lb (82.1 kg)  06/22/20 171 lb 11.2 oz (77.9 kg)  06/20/20 172 lb (78 kg)      ECOG FS:1 - Symptomatic but completely ambulatory  Ocular: Sclerae unicteric, pupils round and equal Ear-nose-throat: Wearing a mask Lymphatic: No cervical or supraclavicular adenopathy Lungs no rales or rhonchi Heart regular rate and rhythm Abd soft, nontender, positive bowel sounds MSK no focal spinal  tenderness, no joint edema Neuro: non-focal, well-oriented, appropriate affect Breasts: I do not palpate a mass in either breast.  There are no skin or nipple changes of concern.  Both axillae are benign.   LAB RESULTS:  CMP     Component Value Date/Time   NA 136 06/20/2020 1018   K 4.1 06/20/2020 1018   CL 103 06/20/2020 1018   CO2 26 06/20/2020 1018   GLUCOSE 95 06/20/2020 1018   GLUCOSE  10/07/2007 0640    79 CORRECTED ON 12/09 AT 0924: PREVIOUSLY REPORTED AS 133   BUN 16 06/20/2020 1018   CREATININE 0.78 06/20/2020 1018   CALCIUM 9.4  06/20/2020 1018   PROT 7.2 09/17/2017 1118   ALBUMIN 4.6 09/17/2017 1118   AST 20 09/17/2017 1118   ALT 20 09/17/2017 1118   ALKPHOS 27 (L) 09/17/2017 1118   BILITOT 0.6 09/17/2017 1118   GFRNONAA >60 06/20/2020 1018   GFRAA >60 06/20/2020 1018    No results found for: TOTALPROTELP, ALBUMINELP, A1GS, A2GS, BETS, BETA2SER, GAMS, MSPIKE, SPEI  Lab Results  Component Value Date   WBC 4.2 06/20/2020   NEUTROABS 6.6 11/07/2007   HGB 12.8 06/20/2020   HCT 39.1 06/20/2020   MCV 92.2 06/20/2020   PLT 244 06/20/2020    No results found for: LABCA2  No components found for: [id number]  No results for input(s): INR in the last 168 hours.  No results found for: LABCA2  No results found for: XBL390  No results found for: ZES923  No results found for: RAQ762  No results found for: CA2729  No components found for: HGQUANT  No results found for: CEA1 / No results found for: CEA1   No results found for: AFPTUMOR  No results found for: CHROMOGRNA  No results found for: KPAFRELGTCHN, LAMBDASER, KAPLAMBRATIO (kappa/lambda light chains)  No results found for: HGBA, HGBA2QUANT, HGBFQUANT, HGBSQUAN (Hemoglobinopathy evaluation)   No results found for: LDH  No results found for: IRON, TIBC, IRONPCTSAT (Iron and TIBC)  No results found for: FERRITIN  Urinalysis    Component Value Date/Time   COLORURINE YELLOW 02/11/2020 1401    APPEARANCEUR Sl Cloudy (A) 02/11/2020 1401   LABSPEC 1.025 02/11/2020 1401   PHURINE 5.5 02/11/2020 1401   GLUCOSEU NEGATIVE 02/11/2020 1401   HGBUR NEGATIVE 02/11/2020 1401   BILIRUBINUR NEGATIVE 02/11/2020 1401   BILIRUBINUR Negative 09/17/2017 1040   KETONESUR 15 (A) 02/11/2020 1401   PROTEINUR Negative 09/17/2017 1040   UROBILINOGEN 0.2 02/11/2020 1401   NITRITE NEGATIVE 02/11/2020 1401   LEUKOCYTESUR NEGATIVE 02/11/2020 1401     STUDIES: No results found.   ELIGIBLE FOR AVAILABLE RESEARCH PROTOCOL: AET  ASSESSMENT: 50 y.o. [name] [name] with a lifetime risk of breast cancer in excess of 30%  (1) risk reduction: Tamoxifen started 11/10/2020  (2) intensified screening:  (a) abbreviated breast MRI yearly in September  (b) bilateral mammography yearly in March  (c) yearly MD breast exam  PLAN: I met today with [name] [name] and reviewed her situation.  The Tyrer-Cuzick risk estimate above I think underestimates her real lifetime risk since her 2 paternal cousins who had breast cancer in their 35s are not included.  I think a closer estimate in her case would be 40% lifetime risk and that is what we used in our discussion today  She has 2 ways to address this issue.  One of them is intensified screening.  She has already had an abbreviated MRI.  She tolerated it well and thankfully there were no false positives.  She is interested in continuing with this strategy and she will have mammography at [facility] in March and breast MRI at [place] [facility] yearly.  This ensures that if she does develop breast cancer it will be detected at the earliest possible time so she will need the least possible treatment for cure  She is also interested in risk reduction.  We discussed several strategies.  Tamoxifen would be a good option for her.  She is aware of the possible toxicities side effects and complications of this agent including the rare cases of abnormal clotting and endometrial  carcinoma.  She is also aware that while taking  tamoxifen if vaginal atrophy becomes a problem she may be able to safely use vaginal estrogens.   I have gone ahead and started her tamoxifen today.  I am setting her up for a virtual visit with me late March.  Assuming no complications I will likely release her to her primary care physician and gynecologist at that point  [name] [name] has a good understanding of the overall plan. She agrees with it. She will call with any problems that may develop before her next visit here.  Total encounter time 55 minutes.Sarajane Jews C. Marcus Schwandt, MD 11/10/2020 5:08 PM Medical Oncology and Hematology [facility] [place],  [address] Tel. [phone]    Fax. [phone]   This document serves as a record of services personally performed by Lurline Del, MD. It was created on his behalf by Wilburn Mylar, a trained medical scribe. The creation of this record is based on the scribe's personal observations and the provider's statements to them.   I, Lurline Del MD, have reviewed the above documentation for accuracy and completeness, and I agree with the above.    *Total Encounter Time as defined by the Centers for Medicare and Medicaid Services includes, in addition to the face-to-face time of a patient visit (documented in the note above) non-face-to-face time: obtaining and reviewing outside history, ordering and reviewing medications, tests or procedures, care coordination (communications with other health care professionals or caregivers) and documentation in the medical record.

## 2020-11-10 ENCOUNTER — Other Ambulatory Visit: Payer: Self-pay

## 2020-11-10 ENCOUNTER — Inpatient Hospital Stay: Payer: 59 | Attending: Hematology and Oncology | Admitting: Oncology

## 2020-11-10 DIAGNOSIS — Z9189 Other specified personal risk factors, not elsewhere classified: Secondary | ICD-10-CM | POA: Insufficient documentation

## 2020-11-10 DIAGNOSIS — Z87891 Personal history of nicotine dependence: Secondary | ICD-10-CM | POA: Diagnosis not present

## 2020-11-10 DIAGNOSIS — Z79899 Other long term (current) drug therapy: Secondary | ICD-10-CM | POA: Diagnosis not present

## 2020-11-10 DIAGNOSIS — Z1239 Encounter for other screening for malignant neoplasm of breast: Secondary | ICD-10-CM | POA: Diagnosis not present

## 2020-11-10 DIAGNOSIS — Z791 Long term (current) use of non-steroidal anti-inflammatories (NSAID): Secondary | ICD-10-CM | POA: Insufficient documentation

## 2020-11-10 DIAGNOSIS — I10 Essential (primary) hypertension: Secondary | ICD-10-CM | POA: Insufficient documentation

## 2020-11-10 DIAGNOSIS — M1990 Unspecified osteoarthritis, unspecified site: Secondary | ICD-10-CM | POA: Diagnosis not present

## 2020-11-10 DIAGNOSIS — Z803 Family history of malignant neoplasm of breast: Secondary | ICD-10-CM | POA: Insufficient documentation

## 2020-11-10 DIAGNOSIS — K219 Gastro-esophageal reflux disease without esophagitis: Secondary | ICD-10-CM | POA: Diagnosis not present

## 2020-11-10 MED ORDER — TAMOXIFEN CITRATE 20 MG PO TABS: 20.0000 mg | ORAL_TABLET | Freq: Every day | ORAL | 12 refills | Status: AC

## 2020-11-14 ENCOUNTER — Telehealth: Payer: Self-pay | Admitting: Oncology

## 2020-11-14 NOTE — Telephone Encounter (Signed)
Scheduled appts per 1/13 los. Left voicemail with appt date and time.

## 2021-01-22 NOTE — Progress Notes (Signed)
[place]  Telephone:(336) [phone] Fax:(336) [phone]     ID: [name] [name] DOB: [birth year]  MR#: [id number]  YIA#:[id number]  Patient Care Team: [name] Barrack, MD as PCP - General (Family Medicine) Brien Few, MD as Consulting Physician (Obstetrics and Gynecology) Chauncey Cruel, MD OTHER MD:  I was unable to connect with [name] [name] on 01/23/21 at  1:00 PM EDT by telephone visit     CHIEF COMPLAINT: At high risk for breast cancer  CURRENT TREATMENT: Tamoxifen; intensified screening   INTERVAL HISTORY: [name] [name]" was to have been contacted today for follow up of her high risk of breast cancer. She was evaluated in the high risk cancer clinic on 11/10/2020 however there was no answer at either of the phones that we have.  I left her message on her private phone.   REVIEW OF SYSTEMS: [name] [name]    COVID 19 VACCINATION STATUS: Status post [place] x2 with booster November 2021   HISTORY OF CURRENT ILLNESS: From the original intake note:  [name] [name] has a strong family history of breast cancer.  This was noted by Dr.Bertrand at the time of mammography and the patient was referred for further evaluation and treatment.  She had an abbreviated breast MRI on 07/13/2019 showing breast density C.  There were no masses or abnormal areas of enhancement and no evidence of adenopathy.  The patient's subsequent history is as detailed below.   PAST MEDICAL HISTORY: Past Medical History:  Diagnosis Date  . Arthritis   . Genital warts   . GERD (gastroesophageal reflux disease)   . Hypertension     PAST SURGICAL HISTORY: Past Surgical History:  Procedure Laterality Date  . APPENDECTOMY  1998   AND OVARIAN CYSTECTOMY  . CESAREAN SECTION  10-15-2019   @[facility]    AND CLOSURE LARGE CYSTOTOMY  . DILATION AND CURETTAGE OF UTERUS  11-07-2007  @[facility]    w/ suction for retained placenta  . HAND SURGERY Right 09/2019   removal of bone for arthritis   . HYSTEROSCOPY N/A 06/22/2020   Procedure: HYSTEROSCOPY;  Surgeon: Brien Few, MD;  Location: [facility];  Service: Gynecology;  Laterality: N/A;  . LAPAROTOMY W/ OVARIAN CYSTECTOMY  1999  . LYMPH NODE DISSECTION  2015 approx.   per pt upper chest area, stated benign  . OVARIAN CYST REMOVAL  1999  . TONSILLECTOMY AND ADENOIDECTOMY  1980    FAMILY HISTORY: Family History  Problem Relation Age of Onset  . Hyperlipidemia Mother   . Cancer Mother        Breast  . Hypertension Mother   . Heart disease Mother   . Osteoarthritis Mother   . Hyperlipidemia Father   . Hypertension Father   . Hearing loss Father   . Gout Father   . Stroke Paternal Grandmother   . Birth defects Brother        CP  . Heart attack Maternal Grandmother   . Alcohol abuse Paternal Uncle   As of January 2022 the patient's father is 51 years old.  The patient has 2 first cousins on the side of the family both with breast cancer in their 63s.  The patient's mother is 61 years old as of January 2022.  She was diagnosed with breast cancer at age 24.  She had 2 sisters who developed breast cancer and died by age 38.  The patient is not aware of any ovarian, pancreatic, or prostate cancer in the family.  She  is not aware of any uterine or colon cancers in the family   GYNECOLOGIC HISTORY:  No LMP recorded. (Menstrual status: IUD). Menarche: 50 years old Age at first live birth: 50 years old [place] P twins LMP age 59 Contraceptive stopped Mirena 2021, remote oral contraceptives HRT no  Hysterectomy? no BSO? 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  She graduated from [employer] and child development.  Her husband [name] (goes by [name]") goes for [employer].  Their twins are 52 years old.  Family attends [employer]    ADVANCED DIRECTIVES: In the absence of any documents to  the contrary the patient's husband is her healthcare power of attorney   HEALTH MAINTENANCE: Social History   Tobacco Use  . Smoking status: Former Smoker    Years: 2.00    Types: Cigarettes    Quit date: 06/16/1987    Years since quitting: 33.6  . Smokeless tobacco: Never Used  Vaping Use  . Vaping Use: Never used  Substance Use Topics  . Alcohol use: Yes    Alcohol/week: 21.0 standard drinks    Types: 21 Glasses of wine per week    Comment: per pt 3 wine daily  . Drug use: Never     Colonoscopy:   PAP: 2020, negative  Bone density:    No Known Allergies  Current Outpatient Medications  Medication Sig Dispense Refill  . amLODipine (NORVASC) 10 MG tablet TAKE 1 TABLET BY MOUTH EVERY DAY 90 tablet 3  . famotidine (PEPCID) 20 MG tablet Take 20 mg by mouth daily as needed for heartburn or indigestion.    Marland Kitchen ibuprofen (ADVIL) 800 MG tablet Take 1 tablet (800 mg total) by mouth every 8 (eight) hours as needed. 30 tablet 0  . loratadine (CLARITIN) 10 MG tablet Take 10 mg by mouth daily.    . valACYclovir (VALTREX) 1000 MG tablet TAKE 2 TABLETS BY MOUTH EVERY 12 (TWELVE) HOURS. TAKE FOR 1 DAY 30 tablet 0   No current facility-administered medications for this visit.    OBJECTIVE: White woman who appears stated age  There were no vitals filed for this visit.   There is no height or weight on file to calculate BMI.   Wt Readings from Last 3 Encounters:  11/10/20 181 lb (82.1 kg)  06/22/20 171 lb 11.2 oz (77.9 kg)  06/20/20 172 lb (78 kg)      ECOG FS:1 - Symptomatic but completely ambulatory  Telemedicine visit 01/23/2021     LAB RESULTS:  CMP     Component Value Date/Time   NA 136 06/20/2020 1018   K 4.1 06/20/2020 1018   CL 103 06/20/2020 1018   CO2 26 06/20/2020 1018   GLUCOSE 95 06/20/2020 1018   GLUCOSE  10/07/2007 0640    79 CORRECTED ON 12/09 AT 0924: PREVIOUSLY REPORTED AS 133   BUN 16 06/20/2020 1018   CREATININE 0.78 06/20/2020 1018   CALCIUM 9.4  06/20/2020 1018   PROT 7.2 09/17/2017 1118   ALBUMIN 4.6 09/17/2017 1118   AST 20 09/17/2017 1118   ALT 20 09/17/2017 1118   ALKPHOS 27 (L) 09/17/2017 1118   BILITOT 0.6 09/17/2017 1118   GFRNONAA >60 06/20/2020 1018   GFRAA >60 06/20/2020 1018    No results found for: TOTALPROTELP, ALBUMINELP, A1GS, A2GS, BETS, BETA2SER, GAMS, MSPIKE, SPEI  Lab Results  Component Value Date  WBC 4.2 06/20/2020   NEUTROABS 6.6 11/07/2007   HGB 12.8 06/20/2020   HCT 39.1 06/20/2020   MCV 92.2 06/20/2020   PLT 244 06/20/2020    No results found for: LABCA2  No components found for: [id number]  No results for input(s): INR in the last 168 hours.  No results found for: LABCA2  No results found for: SEG315  No results found for: VVO160  No results found for: VPX106  No results found for: CA2729  No components found for: HGQUANT  No results found for: CEA1 / No results found for: CEA1   No results found for: AFPTUMOR  No results found for: CHROMOGRNA  No results found for: KPAFRELGTCHN, LAMBDASER, KAPLAMBRATIO (kappa/lambda light chains)  No results found for: HGBA, HGBA2QUANT, HGBFQUANT, HGBSQUAN (Hemoglobinopathy evaluation)   No results found for: LDH  No results found for: IRON, TIBC, IRONPCTSAT (Iron and TIBC)  No results found for: FERRITIN  Urinalysis    Component Value Date/Time   COLORURINE YELLOW 02/11/2020 1401   APPEARANCEUR Sl Cloudy (A) 02/11/2020 1401   LABSPEC 1.025 02/11/2020 1401   PHURINE 5.5 02/11/2020 1401   GLUCOSEU NEGATIVE 02/11/2020 1401   HGBUR NEGATIVE 02/11/2020 1401   BILIRUBINUR NEGATIVE 02/11/2020 1401   BILIRUBINUR Negative 09/17/2017 1040   KETONESUR 15 (A) 02/11/2020 1401   PROTEINUR Negative 09/17/2017 1040   UROBILINOGEN 0.2 02/11/2020 1401   NITRITE NEGATIVE 02/11/2020 1401   LEUKOCYTESUR NEGATIVE 02/11/2020 1401     STUDIES: No results found.   ELIGIBLE FOR AVAILABLE RESEARCH PROTOCOL: AET  ASSESSMENT: 50 y.o.  [place] woman with a lifetime risk of breast cancer in excess of 30%  (1) risk reduction: Tamoxifen started 11/10/2020  (2) intensified screening:  (a) abbreviated breast MRI yearly in September  (b) bilateral mammography yearly in March  (c) yearly MD breast exam   PLAN: I was unable to connect with [name] today.  I am going to set her up to see my 38 assistant in survivorship visit within the next 2 months.   Virgie Dad. Magrinat, MD 01/23/2021 5:48 PM Medical Oncology and Hematology [facility] [place],  [address] Tel. [phone]    Fax. [phone]   This document serves as a record of services personally performed by Lurline Del, MD. It was created on his behalf by Wilburn Mylar, a trained medical scribe. The creation of this record is based on the scribe's personal observations and the provider's statements to them.   I, Lurline Del MD, have reviewed the above documentation for accuracy and completeness, and I agree with the above.   *Total Encounter Time as defined by the Centers for Medicare and Medicaid Services includes, in addition to the face-to-face time of a patient visit (documented in the note above) non-face-to-face time: obtaining and reviewing outside history, ordering and reviewing medications, tests or procedures, care coordination (communications with other health care professionals or caregivers) and documentation in the medical record.

## 2021-01-23 ENCOUNTER — Inpatient Hospital Stay: Payer: 59 | Attending: Hematology and Oncology | Admitting: Oncology

## 2021-01-23 DIAGNOSIS — Z1239 Encounter for other screening for malignant neoplasm of breast: Secondary | ICD-10-CM

## 2021-02-01 ENCOUNTER — Telehealth: Payer: Self-pay | Admitting: Oncology

## 2021-02-01 NOTE — Progress Notes (Incomplete)
[name] [name]  Telephone:([phone] Fax:(336) [phone]     ID: [name] [name] DOB: 1970/12/17  MR#: [id number]  JYN#:[id number]  Patient Care Team: Vivi Barrack, MD as PCP - General (Family Medicine) Brien Few, MD as Consulting Physician (Obstetrics and Gynecology) Aurea Graff OTHER MD:  I connected with [name] on 02/01/21 at  1:30 PM EDT by {Blank single:19197::"video enabled telemedicine visit","telephone visit"} and verified that I am speaking with the correct person using two identifiers.   I discussed the limitations, risks, security and privacy concerns of performing an evaluation and management service by telemedicine and the availability of in-person appointments. I also discussed with the patient that there may be a patient responsible charge related to this service. The patient expressed understanding and agreed to proceed.   Other persons participating in the visit and their role in the encounter: ***   Patient's location: ***  Provider's location: [place]  Total time spent: *** min   CHIEF COMPLAINT: At high risk for breast cancer  CURRENT TREATMENT: Tamoxifen; intensified screening   INTERVAL HISTORY: [name] [name]" was contacted today for follow up of her high risk of breast cancer. She was evaluated in the high risk cancer clinic on 11/10/2020.  We started her on prophylactic tamoxifen at consultation.   REVIEW OF SYSTEMS: [name] [name]    COVID 19 VACCINATION STATUS: Status post [place] x2 with booster November 2021   HISTORY OF CURRENT ILLNESS: From the original intake note:  [name] [name] has a strong family history of breast cancer.  This was noted by Dr.Bertrand at the time of mammography and the patient was referred for further evaluation and treatment.  She had an abbreviated breast MRI on 07/13/2019 showing breast density C.  There were no masses or abnormal areas of enhancement and no  evidence of adenopathy.  The patient's subsequent history is as detailed below.   PAST MEDICAL HISTORY: Past Medical History:  Diagnosis Date  . Arthritis   . Genital warts   . GERD (gastroesophageal reflux disease)   . Hypertension     PAST SURGICAL HISTORY: Past Surgical History:  Procedure Laterality Date  . APPENDECTOMY  1998   AND OVARIAN CYSTECTOMY  . CESAREAN SECTION  10-15-2019   @[facility]    AND CLOSURE LARGE CYSTOTOMY  . DILATION AND CURETTAGE OF UTERUS  11-07-2007  @[facility]    w/ suction for retained placenta  . HAND SURGERY Right 09/2019   removal of bone for arthritis  . HYSTEROSCOPY N/A 06/22/2020   Procedure: HYSTEROSCOPY;  Surgeon: Brien Few, MD;  [facility];  Service: Gynecology;  Laterality: N/A;  . LAPAROTOMY W/ OVARIAN CYSTECTOMY  1999  . LYMPH NODE DISSECTION  2015 approx.   per pt upper chest area, stated benign  . OVARIAN CYST REMOVAL  1999  . TONSILLECTOMY AND ADENOIDECTOMY  1980    FAMILY HISTORY: Family History  Problem Relation Age of Onset  . Hyperlipidemia Mother   . Cancer Mother        Breast  . Hypertension Mother   . Heart disease Mother   . Osteoarthritis Mother   . Hyperlipidemia Father   . Hypertension Father   . Hearing loss Father   . Gout Father   . Stroke Paternal Grandmother   . Birth defects Brother        CP  . Heart attack Maternal Grandmother   . Alcohol abuse Paternal Uncle   As of January 2022 the  patient's father is 22 years old.  The patient has 2 first cousins on the side of the family both with breast cancer in their 57s.  The patient's mother is 48 years old as of January 2022.  She was diagnosed with breast cancer at age 79.  She had 2 sisters who developed breast cancer and died by age 53.  The patient is not aware of any ovarian, pancreatic, or prostate cancer in the family.  She is not aware of any uterine or colon cancers in the family   GYNECOLOGIC HISTORY:  No LMP recorded. (Menstrual  status: IUD). Menarche: 50 years old Age at first live birth: 50 years old [place] P twins LMP age 48 Contraceptive stopped Mirena 2021, remote oral contraceptives HRT no  Hysterectomy? no BSO? 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  She graduated from [employer] and child development.  Her husband [name] (goes by [name]") goes for [employer].  Their twins are 52 years old.  Family attends [employer]    ADVANCED DIRECTIVES: In the absence of any documents to the contrary the patient's husband is her healthcare power of attorney   HEALTH MAINTENANCE: Social History   Tobacco Use  . Smoking status: Former Smoker    Years: 2.00    Types: Cigarettes    Quit date: 06/16/1987    Years since quitting: 33.6  . Smokeless tobacco: Never Used  Vaping Use  . Vaping Use: Never used  Substance Use Topics  . Alcohol use: Yes    Alcohol/week: 21.0 standard drinks    Types: 21 Glasses of wine per week    Comment: per pt 3 wine daily  . Drug use: Never     Colonoscopy:   PAP: 2020, negative  Bone density:    No Known Allergies  Current Outpatient Medications  Medication Sig Dispense Refill  . amLODipine (NORVASC) 10 MG tablet TAKE 1 TABLET BY MOUTH EVERY DAY 90 tablet 3  . famotidine (PEPCID) 20 MG tablet Take 20 mg by mouth daily as needed for heartburn or indigestion.    Marland Kitchen ibuprofen (ADVIL) 800 MG tablet Take 1 tablet (800 mg total) by mouth every 8 (eight) hours as needed. 30 tablet 0  . loratadine (CLARITIN) 10 MG tablet Take 10 mg by mouth daily.    . valACYclovir (VALTREX) 1000 MG tablet TAKE 2 TABLETS BY MOUTH EVERY 12 (TWELVE) HOURS. TAKE FOR 1 DAY 30 tablet 0   No current facility-administered medications for this visit.    OBJECTIVE: White woman who appears stated age  There were no vitals filed for this visit.   There is no height or weight on file  to calculate BMI.   Wt Readings from Last 3 Encounters:  11/10/20 181 lb (82.1 kg)  06/22/20 171 lb 11.2 oz (77.9 kg)  06/20/20 172 lb (78 kg)      ECOG FS:1 - Symptomatic but completely ambulatory  Telemedicine visit 02/02/2021   LAB RESULTS:  CMP     Component Value Date/Time   NA 136 06/20/2020 1018   K 4.1 06/20/2020 1018   CL 103 06/20/2020 1018   CO2 26 06/20/2020 1018   GLUCOSE 95 06/20/2020 1018   GLUCOSE  10/07/2007 0640    79 CORRECTED ON 12/09 AT 0924: PREVIOUSLY REPORTED AS 133   BUN 16 06/20/2020 1018   CREATININE 0.78 06/20/2020 1018  CALCIUM 9.4 06/20/2020 1018   PROT 7.2 09/17/2017 1118   ALBUMIN 4.6 09/17/2017 1118   AST 20 09/17/2017 1118   ALT 20 09/17/2017 1118   ALKPHOS 27 (L) 09/17/2017 1118   BILITOT 0.6 09/17/2017 1118   GFRNONAA >60 06/20/2020 1018   GFRAA >60 06/20/2020 1018    No results found for: TOTALPROTELP, ALBUMINELP, A1GS, A2GS, BETS, BETA2SER, GAMS, MSPIKE, SPEI  Lab Results  Component Value Date   WBC 4.2 06/20/2020   NEUTROABS 6.6 11/07/2007   HGB 12.8 06/20/2020   HCT 39.1 06/20/2020   MCV 92.2 06/20/2020   PLT 244 06/20/2020    No results found for: LABCA2  No components found for: [id number]  No results for input(s): INR in the last 168 hours.  No results found for: LABCA2  No results found for: UKG254  No results found for: YHC623  No results found for: JSE831  No results found for: CA2729  No components found for: HGQUANT  No results found for: CEA1 / No results found for: CEA1   No results found for: AFPTUMOR  No results found for: CHROMOGRNA  No results found for: KPAFRELGTCHN, LAMBDASER, KAPLAMBRATIO (kappa/lambda light chains)  No results found for: HGBA, HGBA2QUANT, HGBFQUANT, HGBSQUAN (Hemoglobinopathy evaluation)   No results found for: LDH  No results found for: IRON, TIBC, IRONPCTSAT (Iron and TIBC)  No results found for: FERRITIN  Urinalysis    Component Value Date/Time    COLORURINE YELLOW 02/11/2020 1401   APPEARANCEUR Sl Cloudy (A) 02/11/2020 1401   LABSPEC 1.025 02/11/2020 1401   PHURINE 5.5 02/11/2020 1401   GLUCOSEU NEGATIVE 02/11/2020 1401   HGBUR NEGATIVE 02/11/2020 1401   BILIRUBINUR NEGATIVE 02/11/2020 1401   BILIRUBINUR Negative 09/17/2017 1040   KETONESUR 15 (A) 02/11/2020 1401   PROTEINUR Negative 09/17/2017 1040   UROBILINOGEN 0.2 02/11/2020 1401   NITRITE NEGATIVE 02/11/2020 1401   LEUKOCYTESUR NEGATIVE 02/11/2020 1401    STUDIES: No results found.   ELIGIBLE FOR AVAILABLE RESEARCH PROTOCOL: AET  ASSESSMENT: 50 y.o. [place] woman with a lifetime risk of breast cancer in excess of 30%  (1) risk reduction: Tamoxifen started 11/10/2020  (2) intensified screening:  (a) abbreviated breast MRI yearly in September  (b) bilateral mammography yearly in March  (c) yearly MD breast exam   PLAN: I was unable to connect with [name] [name] today.  I am going to set her up to see my 67 assistant in survivorship visit within the next 2 months.   Virgie Dad. Magrinat, MD 02/01/2021 9:20 PM Medical Oncology and Hematology [facility] [place], [place] [address] Tel. [phone]    Fax. [phone]   This document serves as a record of services personally performed by Lurline Del, MD. It was created on his behalf by Wilburn Mylar, a trained medical scribe. The creation of this record is based on the scribe's personal observations and the provider's statements to them.   I, Lurline Del MD, have reviewed the above documentation for accuracy and completeness, and I agree with the above.   *Total Encounter Time as defined by the Centers for Medicare and Medicaid Services includes, in addition to the face-to-face time of a patient visit (documented in the note above) non-face-to-face time: obtaining and reviewing outside history, ordering and reviewing medications, tests or procedures, care coordination  (communications with other health care professionals or caregivers) and documentation in the medical record.

## 2021-02-01 NOTE — Telephone Encounter (Signed)
R/s appt per 4/6 sch msg. Left vm with appt date and time.

## 2021-02-02 ENCOUNTER — Telehealth: Payer: Self-pay | Admitting: Oncology

## 2021-02-02 ENCOUNTER — Telehealth: Payer: No Typology Code available for payment source | Admitting: Oncology

## 2021-02-02 NOTE — Telephone Encounter (Signed)
Scheduled per 4/7 sch msg. Called pt and left a msg

## 2021-03-07 NOTE — Progress Notes (Signed)
[place]  Telephone:(336) [phone] Fax:(336) [phone]     ID: [name] [name] DOB: 10-Oct-1971  MR#: [id number]  DDU#:[id number]  Patient Care Team: Vivi Barrack, MD as PCP - General (Family Medicine) Brien Few, MD as Consulting Physician (Obstetrics and Gynecology) Chauncey Cruel, MD OTHER MD:  I connected with [name] [name] on 03/08/21 at  1:30 PM EDT by video enabled telemedicine visit and verified that I am speaking with the correct person using two identifiers.   I discussed the limitations, risks, security and privacy concerns of performing an evaluation and management service by telemedicine and the availability of in-person appointments. I also discussed with the patient that there may be a patient responsible charge related to this service. The patient expressed understanding and agreed to proceed.   Other persons participating in the visit and their role in the encounter: None  Patient's location: Work Provider's location: [facility]  Total time spent: 15 min   CHIEF COMPLAINT: At high risk for breast cancer  CURRENT TREATMENT: Tamoxifen; intensified screening   INTERVAL HISTORY: [name] [name]" was contacted today for follow up of her high risk of breast cancer. She was evaluated in the high risk cancer clinic on 11/10/2020.  She was prescribed tamoxifen at consultation.  She is tolerating this well with no hot flashes or vaginal wetness.  She is also had no periods recently.  She tells me she had 1 scant.  When her IUD was removed and none since.   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  A detailed review of systems today was otherwise stable   COVID 19 VACCINATION STATUS: Status post [place] x2 with booster November 2021   HISTORY OF CURRENT ILLNESS: From the original intake note:  [name] [name] has a strong family  history of breast cancer.  This was noted by Dr.Bertrand at the time of mammography and the patient was referred for further evaluation and treatment.  She had an abbreviated breast MRI on 07/13/2019 showing breast density C.  There were no masses or abnormal areas of enhancement and no evidence of adenopathy.  The patient's subsequent history is as detailed below.   PAST MEDICAL HISTORY: Past Medical History:  Diagnosis Date  . Arthritis   . Genital warts   . GERD (gastroesophageal reflux disease)   . Hypertension     PAST SURGICAL HISTORY: Past Surgical History:  Procedure Laterality Date  . APPENDECTOMY  1998   AND OVARIAN CYSTECTOMY  . CESAREAN SECTION  10-15-2019   @[facility]    AND CLOSURE LARGE CYSTOTOMY  . DILATION AND CURETTAGE OF UTERUS  11-07-2007  @[facility]    w/ suction for retained placenta  . HAND SURGERY Right 09/2019   removal of bone for arthritis  . HYSTEROSCOPY N/A 06/22/2020   Procedure: HYSTEROSCOPY;  Surgeon: Brien Few, MD;  Location: [facility];  Service: Gynecology;  Laterality: N/A;  . LAPAROTOMY W/ OVARIAN CYSTECTOMY  1999  . LYMPH NODE DISSECTION  2015 approx.   per pt upper chest area, stated benign  . OVARIAN CYST REMOVAL  1999  . TONSILLECTOMY AND ADENOIDECTOMY  1980    FAMILY HISTORY: Family History  Problem Relation Age of Onset  . Hyperlipidemia Mother   . Cancer Mother        Breast  . Hypertension Mother   . Heart disease Mother   . Osteoarthritis  Mother   . Hyperlipidemia Father   . Hypertension Father   . Hearing loss Father   . Gout Father   . Stroke Paternal Grandmother   . Birth defects Brother        CP  . Heart attack Maternal Grandmother   . Alcohol abuse Paternal Uncle   As of January 2022 the patient's father is 9 years old.  The patient has 2 first cousins on the side of the family both with breast cancer in their 37s.  The patient's mother is 58 years old as of January 2022.  She was diagnosed with breast  cancer at age 20.  She had 2 sisters who developed breast cancer and died by age 21.  The patient is not aware of any ovarian, pancreatic, or prostate cancer in the family.  She is not aware of any uterine or colon cancers in the family   GYNECOLOGIC HISTORY:  No LMP recorded. (Menstrual status: IUD). Menarche: 50 years old Age at first live birth: 50 years old [place] P twins LMP age 7 Contraceptive stopped Mirena 2021, remote oral contraceptives HRT no  Hysterectomy? no BSO? 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  She also sometimes works at [employer], which she enjoys . She graduated from [employer] in child development.  Her husband [name] [name] (goes by [name] [name]") works for [employer].  Their twins are 52 years old.  Family attends [employer]    ADVANCED DIRECTIVES: In the absence of any documents to the contrary the patient's husband is her healthcare power of attorney   HEALTH MAINTENANCE: Social History   Tobacco Use  . Smoking status: Former Smoker    Years: 2.00    Types: Cigarettes    Quit date: 06/16/1987    Years since quitting: 33.7  . Smokeless tobacco: Never Used  Vaping Use  . Vaping Use: Never used  Substance Use Topics  . Alcohol use: Yes    Alcohol/week: 21.0 standard drinks    Types: 21 Glasses of wine per week    Comment: per pt 3 wine daily  . Drug use: Never     Colonoscopy:   PAP: 2020, negative  Bone density:    No Known Allergies  Current Outpatient Medications  Medication Sig Dispense Refill  . diazepam (VALIUM) 5 MG tablet Take one tablet shortly before MRI test; may repeat times 1 2 tablet 0  . amLODipine (NORVASC) 10 MG tablet TAKE 1 TABLET BY MOUTH EVERY DAY 90 tablet 3  . famotidine (PEPCID) 20 MG tablet Take 20 mg by mouth daily as needed for heartburn or indigestion.    Marland Kitchen ibuprofen (ADVIL) 800 MG tablet Take 1  tablet (800 mg total) by mouth every 8 (eight) hours as needed. 30 tablet 0  . loratadine (CLARITIN) 10 MG tablet Take 10 mg by mouth daily.    . valACYclovir (VALTREX) 1000 MG tablet TAKE 2 TABLETS BY MOUTH EVERY 12 (TWELVE) HOURS. TAKE FOR 1 DAY 30 tablet 0   No current facility-administered medications for this visit.    OBJECTIVE: White woman who appears stated age  There were no vitals filed for this visit.   There is no height or weight on file to calculate BMI.   Wt Readings from Last 3 Encounters:  11/10/20 181 lb (82.1 kg)  06/22/20 171 lb 11.2 oz (77.9 kg)  06/20/20 172 lb (78 kg)      ECOG FS:1 - Symptomatic but completely ambulatory  Telemedicine visit 03/08/2021   LAB RESULTS:  CMP     Component Value Date/Time   NA 136 06/20/2020 1018   K 4.1 06/20/2020 1018   CL 103 06/20/2020 1018   CO2 26 06/20/2020 1018   GLUCOSE 95 06/20/2020 1018   GLUCOSE  10/07/2007 0640    77 CORRECTED ON 12/09 AT 0924: PREVIOUSLY REPORTED AS 133   BUN 16 06/20/2020 1018   CREATININE 0.78 06/20/2020 1018   CALCIUM 9.4 06/20/2020 1018   PROT 7.2 09/17/2017 1118   ALBUMIN 4.6 09/17/2017 1118   AST 20 09/17/2017 1118   ALT 20 09/17/2017 1118   ALKPHOS 27 (L) 09/17/2017 1118   BILITOT 0.6 09/17/2017 1118   GFRNONAA >60 06/20/2020 1018   GFRAA >60 06/20/2020 1018    No results found for: TOTALPROTELP, ALBUMINELP, A1GS, A2GS, BETS, BETA2SER, GAMS, MSPIKE, SPEI  Lab Results  Component Value Date   WBC 4.2 06/20/2020   NEUTROABS 6.6 11/07/2007   HGB 12.8 06/20/2020   HCT 39.1 06/20/2020   MCV 92.2 06/20/2020   PLT 244 06/20/2020    No results found for: LABCA2  No components found for: [id number]  No results for input(s): INR in the last 168 hours.  No results found for: LABCA2  No results found for: [id number]  No results found for: [id number]  No results found for: [id number]  No results found for: CA2729  No components found for: HGQUANT  No results found for: CEA1 / No  results found for: CEA1   No results found for: AFPTUMOR  No results found for: CHROMOGRNA  No results found for: KPAFRELGTCHN, LAMBDASER, KAPLAMBRATIO (kappa/lambda light chains)  No results found for: HGBA, HGBA2QUANT, HGBFQUANT, HGBSQUAN (Hemoglobinopathy evaluation)   No results found for: LDH  No results found for: IRON, TIBC, IRONPCTSAT (Iron and TIBC)  No results found for: FERRITIN  Urinalysis    Component Value Date/Time   COLORURINE YELLOW 02/11/2020 1401   APPEARANCEUR Sl Cloudy (A) 02/11/2020 1401   LABSPEC 1.025 02/11/2020 1401   PHURINE 5.5 02/11/2020 1401   GLUCOSEU NEGATIVE 02/11/2020 1401   HGBUR NEGATIVE 02/11/2020 1401   BILIRUBINUR NEGATIVE 02/11/2020 1401   BILIRUBINUR Negative 09/17/2017 1040   KETONESUR 15 (A) 02/11/2020 1401   PROTEINUR Negative 09/17/2017 1040   UROBILINOGEN 0.2 02/11/2020 1401   NITRITE NEGATIVE 02/11/2020 1401   LEUKOCYTESUR NEGATIVE 02/11/2020 1401     STUDIES: No results found.   ELIGIBLE FOR AVAILABLE RESEARCH PROTOCOL: AET  ASSESSMENT: 50 y.o. [place] woman with a lifetime risk of breast cancer in excess of 30%  (1) risk reduction: Tamoxifen started 11/10/2020  (2) intensified screening:  (a) abbreviated breast MRI yearly in September  (b) bilateral mammography yearly in March  (c) yearly MD breast exam   PLAN: [name] [name] is tolerating tamoxifen well and the plan is to continue that a total of 5 years.  By doing that she will cut in half her risk of breast cancer.  She is interested in pursuing intensified screening.  She is due for repeat MRI and I have sent that order in for her.  She gets quite anxious with the MRI so I have written for Valium to be taken just before the test.  She will then have her mammogram at [facility] in November.  She will have a virtual visit with me in December after that test.  She knows to call for  any other issue that may develop before then  Sarajane Jews C. Dixie Jafri, MD 03/08/2021 2:25  PM Medical Oncology and Hematology [facility] [place], [place] [address] Tel. [phone]    Fax. [phone]   This document serves as a record of services personally performed by Lurline Del, MD. It was created on his behalf by Wilburn Mylar, a trained medical scribe. The creation of this record is based on the scribe's personal observations and the provider's statements to them.   I, Lurline Del MD, have reviewed the above documentation for accuracy and completeness, and I agree with the above.   *Total Encounter Time as defined by the Centers for Medicare and Medicaid Services includes, in addition to the face-to-face time of a patient visit (documented in the note above) non-face-to-face time: obtaining and reviewing outside history, ordering and reviewing medications, tests or procedures, care coordination (communications with other health care professionals or caregivers) and documentation in the medical record.

## 2021-03-08 ENCOUNTER — Inpatient Hospital Stay: Payer: 59 | Attending: Hematology and Oncology | Admitting: Oncology

## 2021-03-08 DIAGNOSIS — Z1239 Encounter for other screening for malignant neoplasm of breast: Secondary | ICD-10-CM

## 2021-03-08 DIAGNOSIS — Z803 Family history of malignant neoplasm of breast: Secondary | ICD-10-CM | POA: Insufficient documentation

## 2021-03-08 DIAGNOSIS — Z1501 Genetic susceptibility to malignant neoplasm of breast: Secondary | ICD-10-CM | POA: Insufficient documentation

## 2021-03-08 MED ORDER — DIAZEPAM 5 MG PO TABS: ORAL_TABLET | ORAL | 0 refills | Status: DC

## 2021-03-20 ENCOUNTER — Encounter: Payer: Self-pay | Admitting: Oncology

## 2021-03-21 ENCOUNTER — Encounter: Payer: Self-pay | Admitting: Oncology

## 2021-03-21 ENCOUNTER — Ambulatory Visit
Admission: RE | Admit: 2021-03-21 | Discharge: 2021-03-21 | Disposition: A | Discharge status: Home / Self Care | Payer: 59 | Source: Ambulatory Visit | Attending: Oncology | Admitting: Oncology

## 2021-03-21 ENCOUNTER — Other Ambulatory Visit: Payer: Self-pay

## 2021-03-21 DIAGNOSIS — Z1239 Encounter for other screening for malignant neoplasm of breast: Secondary | ICD-10-CM

## 2021-03-21 MED ORDER — GADOBUTROL 1 MMOL/ML IV SOLN
8.0000 mL | Freq: Once | INTRAVENOUS | Status: AC | PRN
  Administered 2021-03-21: 8 mL via INTRAVENOUS

## 2021-03-22 ENCOUNTER — Encounter: Payer: Self-pay | Admitting: Oncology

## 2021-03-22 ENCOUNTER — Other Ambulatory Visit: Payer: Self-pay | Admitting: *Deleted

## 2021-08-22 ENCOUNTER — Other Ambulatory Visit (HOSPITAL_BASED_OUTPATIENT_CLINIC_OR_DEPARTMENT_OTHER): Payer: Self-pay

## 2021-08-22 ENCOUNTER — Other Ambulatory Visit: Payer: Self-pay | Admitting: Family Medicine

## 2021-08-22 MED ORDER — CARESTART COVID-19 HOME TEST VI KIT
PACK | 0 refills | Status: DC
Start: 2021-08-22 — End: 2021-09-06
  Filled 2021-08-22: qty 4, 4d supply, fill #0

## 2021-08-23 ENCOUNTER — Other Ambulatory Visit (HOSPITAL_BASED_OUTPATIENT_CLINIC_OR_DEPARTMENT_OTHER): Payer: Self-pay

## 2021-09-01 ENCOUNTER — Encounter: Payer: Self-pay | Admitting: Family Medicine

## 2021-09-01 NOTE — Telephone Encounter (Signed)
Please advise 

## 2021-09-04 ENCOUNTER — Other Ambulatory Visit: Payer: Self-pay | Admitting: *Deleted

## 2021-09-04 MED ORDER — AMLODIPINE BESYLATE 10 MG PO TABS
10.0000 mg | ORAL_TABLET | Freq: Every day | ORAL | 0 refills | Status: DC
Start: 2021-09-04 — End: 2021-09-06

## 2021-09-06 ENCOUNTER — Ambulatory Visit: Payer: 59 | Admitting: Family Medicine

## 2021-09-06 ENCOUNTER — Other Ambulatory Visit: Payer: Self-pay

## 2021-09-06 ENCOUNTER — Encounter: Payer: Self-pay | Admitting: Family Medicine

## 2021-09-06 VITALS — BP 116/80 | HR 80 | Temp 97.7°F | Wt 180.0 lb

## 2021-09-06 DIAGNOSIS — I10 Essential (primary) hypertension: Secondary | ICD-10-CM

## 2021-09-06 DIAGNOSIS — E785 Hyperlipidemia, unspecified: Secondary | ICD-10-CM

## 2021-09-06 DIAGNOSIS — Z23 Encounter for immunization: Secondary | ICD-10-CM | POA: Diagnosis not present

## 2021-09-06 DIAGNOSIS — K219 Gastro-esophageal reflux disease without esophagitis: Secondary | ICD-10-CM

## 2021-09-06 DIAGNOSIS — R739 Hyperglycemia, unspecified: Secondary | ICD-10-CM | POA: Diagnosis not present

## 2021-09-06 DIAGNOSIS — Z9189 Other specified personal risk factors, not elsewhere classified: Secondary | ICD-10-CM

## 2021-09-06 LAB — COMPREHENSIVE METABOLIC PANEL
ALT: 25 U/L (ref 0–35)
AST: 26 U/L (ref 0–37)
Albumin: 4.5 g/dL (ref 3.5–5.2)
Alkaline Phosphatase: 34 U/L — ABNORMAL LOW (ref 39–117)
BUN: 15 mg/dL (ref 6–23)
CO2: 26 mEq/L (ref 19–32)
Calcium: 9.1 mg/dL (ref 8.4–10.5)
Chloride: 106 mEq/L (ref 96–112)
Creatinine, Ser: 0.76 mg/dL (ref 0.40–1.20)
GFR: 90.24 mL/min (ref >60.00)
Glucose, Bld: 95 mg/dL (ref 70–99)
Potassium: 4.2 mEq/L (ref 3.5–5.1)
Sodium: 138 mEq/L (ref 135–145)
Total Bilirubin: 0.6 mg/dL (ref 0.2–1.2)
Total Protein: 7.1 g/dL (ref 6.0–8.3)

## 2021-09-06 LAB — CBC
HCT: 38.3 % (ref 36.0–46.0)
Hemoglobin: 12.8 g/dL (ref 12.0–15.0)
MCHC: 33.3 g/dL (ref 30.0–36.0)
MCV: 89.4 fl (ref 78.0–100.0)
Platelets: 240 10*3/uL (ref 150.0–400.0)
RBC: 4.28 Mil/uL (ref 3.87–5.11)
RDW: 14.6 % (ref 11.5–15.5)
WBC: 2.7 10*3/uL — ABNORMAL LOW (ref 4.0–10.5)

## 2021-09-06 LAB — LIPID PANEL
Cholesterol: 235 mg/dL — ABNORMAL HIGH (ref 0–200)
HDL: 76.6 mg/dL (ref >39.00)
LDL Cholesterol: 137 mg/dL — ABNORMAL HIGH (ref 0–99)
NonHDL: 158.17
Total CHOL/HDL Ratio: 3
Triglycerides: 106 mg/dL (ref 0.0–149.0)
VLDL: 21.2 mg/dL (ref 0.0–40.0)

## 2021-09-06 LAB — HEMOGLOBIN A1C: Hgb A1c MFr Bld: 5.7 % (ref 4.6–6.5)

## 2021-09-06 LAB — TSH: TSH: 2.27 u[IU]/mL (ref 0.35–5.50)

## 2021-09-06 MED ORDER — AMLODIPINE BESYLATE 10 MG PO TABS
10.0000 mg | ORAL_TABLET | Freq: Every day | ORAL | 3 refills | Status: DC
Start: 2021-09-06 — End: 2022-10-30

## 2021-09-06 MED ORDER — VALACYCLOVIR HCL 1 G PO TABS
ORAL_TABLET | ORAL | 0 refills | Status: DC
Start: 2021-09-06 — End: 2023-01-18

## 2021-09-06 NOTE — Progress Notes (Signed)
   Dafney Farler is a 50 y.o. female who presents today for an office visit.  Assessment/Plan:  Chronic Problems Addressed Today: Dyslipidemia Check labs.   Essential hypertension At goal on amlodipine 10 mg daily.  Refill sent in today.  Check labs.  GERD (gastroesophageal reflux disease) Stable on Pepcid 20 mg daily.  At high risk for breast cancer Stable on tamoxifen per onc.  Minimal side effects.  Flu shot given today.     Subjective:  HPI:  See A/p for status of chronic conditions.         Objective:  Physical Exam: BP 116/80   Pulse 80   Temp 97.7 F (36.5 C) (Temporal)   Wt 180 lb (81.6 kg)   SpO2 95%   BMI 30.90 kg/m   Gen: No acute distress, resting comfortably CV: Regular rate and rhythm with no murmurs appreciated Pulm: Normal work of breathing, clear to auscultation bilaterally with no crackles, wheezes, or rhonchi Neuro: Grossly normal, moves all extremities Psych: Normal affect and thought content      Pami Wool M. Jerline Pain, MD 09/06/2021 9:23 AM

## 2021-09-06 NOTE — Assessment & Plan Note (Signed)
Stable on tamoxifen per onc.  Minimal side effects.

## 2021-09-06 NOTE — Patient Instructions (Signed)
It was very nice to see you today!  We will refill your medications today.  We will give you a flu shot today.  We will check blood work.  I will see you back in a year. Come back sooner if needed.   Take care, Dr Jerline Pain  PLEASE NOTE:  If you had any lab tests please let us know if you have not heard back within a few days. You may see your results on mychart before we have a chance to review them but we will give you a call once they are reviewed by [place]. If we ordered any referrals today, please let us know if you have not heard from their office within the next week.   Please try these tips to maintain a healthy lifestyle:  Eat at least 3 REAL meals and 1-2 snacks per day.  Aim for no more than 5 hours between eating.  If you eat breakfast, please do so within one hour of getting up.   Each meal should contain half fruits/vegetables, one quarter protein, and one quarter carbs (no bigger than a computer mouse)  Cut down on sweet beverages. This includes juice, soda, and sweet tea.   Drink at least 1 glass of water with each meal and aim for at least 8 glasses per day  Exercise at least 150 minutes every week.

## 2021-09-06 NOTE — Assessment & Plan Note (Signed)
At goal on amlodipine 10 mg daily.  Refill sent in today.  Check labs.

## 2021-09-06 NOTE — Assessment & Plan Note (Signed)
Stable on Pepcid 20 mg daily. 

## 2021-09-06 NOTE — Assessment & Plan Note (Signed)
Check labs 

## 2021-09-07 NOTE — Progress Notes (Signed)
Please inform patient of the following:  Cholesterol is borderline elevated but everything else is STABLE. Do not need to start medications but she should continue working on diet and exercise and we can recheck in a year.  Algis Greenhouse. Jerline Pain, MD 09/07/2021 10:42 AM

## 2021-10-03 NOTE — Progress Notes (Signed)
[place]  [phone] [phone]     ID: [name] [name] DOB: 06-19-1971  MR#: [id number]  KY[facility]#:[id number]  Patient Care Team: Vivi Barrack, MD as PCP - General (Family Medicine) Brien Few, MD as Consulting Physician (Obstetrics and Gynecology) Chauncey Cruel, MD OTHER MD:  I connected with [name] [name] on 10/04/21 at  1:30 PM EST by video enabled telemedicine visit and verified that I am speaking with the correct person using two identifiers.   I discussed the limitations, risks, security and privacy concerns of performing an evaluation and management service by telemedicine and the availability of in-person appointments. I also discussed with the patient that there may be a patient responsible charge related to this service. The patient expressed understanding and agreed to proceed.   Other persons participating in the visit and their role in the encounter: None  Patient's location: Work Provider's location: [facility]  Total time spent: 15 min   CHIEF COMPLAINT: At high risk for breast cancer  CURRENT TREATMENT: Tamoxifen; intensified screening   INTERVAL HISTORY: [name] [name]" was contacted today for follow up of her high risk of breast cancer.   She started tamoxifen on 11/10/2020.  She is tolerating this well with no hot flashes or vaginal wetness.  She is also had no periods recently (recall she had 1 scant.  After her IUD was removed and nonsense).  Since her last visit, she underwent screening breast MRI on 03/21/2021 showing: breast composition C; no evidence of malignancy in either breast.  She also bilateral screening mammography with tomography at [facility] on 07/26/2021 showing: breast density category C; no evidence of malignancy in either breast.    REVIEW OF SYSTEMS: A detailed review of systems today was otherwise noncontributory   COVID 19 VACCINATION STATUS: Status post Ponder x2 with  booster November 2021   HISTORY OF CURRENT ILLNESS: From the original intake note:  [name] [name] has a strong family history of breast cancer.  This was noted by Dr.Bertrand at the time of mammography and the patient was referred for further evaluation and treatment.  She had an abbreviated breast MRI on 07/13/2019 showing breast density C.  There were no masses or abnormal areas of enhancement and no evidence of adenopathy.  The patient's subsequent history is as detailed below.   PAST MEDICAL HISTORY: Past Medical History:  Diagnosis Date   Arthritis    Genital warts    GERD (gastroesophageal reflux disease)    Hypertension     PAST SURGICAL HISTORY: Past Surgical History:  Procedure Laterality Date   APPENDECTOMY  1998   AND OVARIAN CYSTECTOMY   CESAREAN SECTION  10-15-2019   @[facility]    AND CLOSURE LARGE CYSTOTOMY   DILATION AND CURETTAGE OF UTERUS  11-07-2007  @[facility]    w/ suction for retained placenta   HAND SURGERY Right 09/2019   removal of bone for arthritis   HYSTEROSCOPY N/A 06/22/2020   Procedure: HYSTEROSCOPY;  Surgeon: Brien Few, MD;  Location: [place];  Service: Gynecology;  Laterality: N/A;   LAPAROTOMY W/ OVARIAN CYSTECTOMY  1999   LYMPH NODE DISSECTION  2015 approx.   per pt upper chest area, stated benign   OVARIAN CYST REMOVAL  1999   TONSILLECTOMY AND ADENOIDECTOMY  1980    FAMILY HISTORY: Family History  Problem Relation Age of Onset   Hyperlipidemia Mother    Cancer Mother        Breast   Hypertension  Mother    Heart disease Mother    Osteoarthritis Mother    Hyperlipidemia Father    Hypertension Father    Hearing loss Father    Gout Father    Stroke Paternal Grandmother    Birth defects Brother        CP   Heart attack Maternal Grandmother    Alcohol abuse Paternal Uncle   As of January 2022 the patient's father is 12 years old.  The patient has 2 first cousins on the side of the family both with breast cancer in  their 42s.  The patient's mother is 23 years old as of January 2022.  She was diagnosed with breast cancer at age 38.  She had 2 sisters who developed breast cancer and died by age 90.  The patient is not aware of any ovarian, pancreatic, or prostate cancer in the family.  She is not aware of any uterine or colon cancers in the family   GYNECOLOGIC HISTORY:  No LMP recorded. (Menstrual status: IUD). Menarche: 50 years old Age at first live birth: 50 years old [place] P twins LMP age 3 Contraceptive stopped Mirena 2021, remote oral contraceptives HRT no  Hysterectomy? no BSO? 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  She also sometimes works at [employer], which she enjoys . She graduated from [employer] in child development.  Her husband [name] (goes by [employer]") works for [employer].  Their twins are 52 years old.  Family attends [employer]    ADVANCED DIRECTIVES: In the absence of any documents to the contrary the patient's husband is her healthcare power of attorney   HEALTH MAINTENANCE: Social History   Tobacco Use   Smoking status: Former    Years: 2.00    Types: Cigarettes    Quit date: 06/16/1987    Years since quitting: 34.3   Smokeless tobacco: Never  Vaping Use   Vaping Use: Never used  Substance Use Topics   Alcohol use: Yes    Alcohol/week: 21.0 standard drinks    Types: 21 Glasses of wine per week    Comment: per pt 3 wine daily   Drug use: Never     Colonoscopy:   PAP: 2020, negative  Bone density:    No Known Allergies  Current Outpatient Medications  Medication Sig Dispense Refill   amLODipine (NORVASC) 10 MG tablet Take 1 tablet (10 mg total) by mouth daily. 90 tablet 3   diazepam (VALIUM) 5 MG tablet Take one tablet shortly before MRI test; may repeat times 1 2 tablet 0   famotidine (PEPCID) 20 MG tablet Take 20 mg by mouth  daily as needed for heartburn or indigestion.     ibuprofen (ADVIL) 800 MG tablet Take 1 tablet (800 mg total) by mouth every 8 (eight) hours as needed. 30 tablet 0   loratadine (CLARITIN) 10 MG tablet Take 10 mg by mouth daily.     tamoxifen (NOLVADEX) 20 MG tablet Take 20 mg by mouth daily.     valACYclovir (VALTREX) 1000 MG tablet TAKE 2 TABLETS BY MOUTH EVERY 12 (TWELVE) HOURS. TAKE FOR 1 DAY 30 tablet 0   No current facility-administered medications for this visit.    OBJECTIVE: White woman who appears well  There were no vitals filed for this visit.   There is no height or weight on file to  calculate BMI.   Wt Readings from Last 3 Encounters:  09/06/21 180 lb (81.6 kg)  11/10/20 181 lb (82.1 kg)  06/22/20 171 lb 11.2 oz (77.9 kg)      ECOG FS:1 - Symptomatic but completely ambulatory  Telemedicine visit 10/04/2021   LAB RESULTS:  CMP     Component Value Date/Time   NA 138 09/06/2021 0935   K 4.2 09/06/2021 0935   CL 106 09/06/2021 0935   CO2 26 09/06/2021 0935   GLUCOSE 95 09/06/2021 0935   GLUCOSE  10/07/2007 0640    79 CORRECTED ON 12/09 AT 0924: PREVIOUSLY REPORTED AS 133   BUN 15 09/06/2021 0935   CREATININE 0.76 09/06/2021 0935   CALCIUM 9.1 09/06/2021 0935   PROT 7.1 09/06/2021 0935   ALBUMIN 4.5 09/06/2021 0935   AST 26 09/06/2021 0935   ALT 25 09/06/2021 0935   ALKPHOS 34 (L) 09/06/2021 0935   BILITOT 0.6 09/06/2021 0935   GFRNONAA >60 06/20/2020 1018   GFRAA >60 06/20/2020 1018    No results found for: TOTALPROTELP, ALBUMINELP, A1GS, A2GS, BETS, BETA2SER, GAMS, MSPIKE, SPEI  Lab Results  Component Value Date   WBC 2.7 (L) 09/06/2021   NEUTROABS 6.6 11/07/2007   HGB 12.8 09/06/2021   HCT 38.3 09/06/2021   MCV 89.4 09/06/2021   PLT 240.0 09/06/2021    No results found for: LABCA2  No components found for: CXKGYJ856  No results for input(s): INR in the last 168 hours.  No results found for: LABCA2  No results found for: DJS970  No  results found for: YOV785  No results found for: YIF027  No results found for: CA2729  No components found for: HGQUANT  No results found for: CEA1 / No results found for: CEA1   No results found for: AFPTUMOR  No results found for: CHROMOGRNA  No results found for: KPAFRELGTCHN, LAMBDASER, KAPLAMBRATIO (kappa/lambda light chains)  No results found for: HGBA, HGBA2QUANT, HGBFQUANT, HGBSQUAN (Hemoglobinopathy evaluation)   No results found for: LDH  No results found for: IRON, TIBC, IRONPCTSAT (Iron and TIBC)  No results found for: FERRITIN  Urinalysis    Component Value Date/Time   COLORURINE YELLOW 02/11/2020 1401   APPEARANCEUR Sl Cloudy (A) 02/11/2020 1401   LABSPEC 1.025 02/11/2020 1401   PHURINE 5.5 02/11/2020 1401   GLUCOSEU NEGATIVE 02/11/2020 1401   HGBUR NEGATIVE 02/11/2020 1401   BILIRUBINUR NEGATIVE 02/11/2020 1401   BILIRUBINUR Negative 09/17/2017 1040   KETONESUR 15 (A) 02/11/2020 1401   PROTEINUR Negative 09/17/2017 1040   UROBILINOGEN 0.2 02/11/2020 1401   NITRITE NEGATIVE 02/11/2020 1401   LEUKOCYTESUR NEGATIVE 02/11/2020 1401     STUDIES: No results found.   ELIGIBLE FOR AVAILABLE RESEARCH PROTOCOL: AET  ASSESSMENT: 50 y.o. [place] woman with a lifetime risk of breast cancer in excess of 30%  (1) risk reduction: Tamoxifen started 11/10/2020  (2) intensified screening:  (a) abbreviated breast MRI yearly in September  (b) bilateral mammography yearly in March  (c) yearly MD breast exam   PLAN: [name] [name] is on tamoxifen for risk reduction, tolerating that well.  The plan is to continue for a total of 5 years.  She is also under intensified screening.  She will have a repeat breast MRI in March, and repeat mammography in November.  She will see [place] again after the MRI.  She knows to call for any other issue that may develop before the next visit  Sarajane Jews C. Sayed Apostol, MD 10/04/2021 1:33 PM Medical Oncology and Hematology  [facility], [place] [address] Tel. [phone]    Fax. [phone]   This document serves as a record of services personally performed by Lurline Del, MD. It was created on his behalf by Wilburn Mylar, a trained medical scribe. The creation of this record is based on the scribe's personal observations and the provider's statements to them.   I, Lurline Del MD, have reviewed the above documentation for accuracy and completeness, and I agree with the above.   *Total Encounter Time as defined by the Centers for Medicare and Medicaid Services includes, in addition to the face-to-face time of a patient visit (documented in the note above) non-face-to-face time: obtaining and reviewing outside history, ordering and reviewing medications, tests or procedures, care coordination (communications with other health care professionals or caregivers) and documentation in the medical record.

## 2021-10-04 ENCOUNTER — Telehealth (HOSPITAL_BASED_OUTPATIENT_CLINIC_OR_DEPARTMENT_OTHER): Payer: 59 | Admitting: Oncology

## 2021-10-04 DIAGNOSIS — Z1239 Encounter for other screening for malignant neoplasm of breast: Secondary | ICD-10-CM | POA: Diagnosis not present

## 2021-10-04 MED ORDER — DIAZEPAM 5 MG PO TABS: ORAL_TABLET | ORAL | 0 refills | Status: DC

## 2021-10-04 MED ORDER — TAMOXIFEN CITRATE 20 MG PO TABS
20.0000 mg | ORAL_TABLET | Freq: Every day | ORAL | 4 refills | Status: DC
Start: 2021-10-04 — End: 2022-11-16

## 2021-11-20 ENCOUNTER — Other Ambulatory Visit (HOSPITAL_BASED_OUTPATIENT_CLINIC_OR_DEPARTMENT_OTHER): Payer: Self-pay

## 2021-11-20 MED ORDER — COVID-19 AT HOME ANTIGEN TEST VI KIT
PACK | 0 refills | Status: DC
  Filled 2021-11-20 – 2021-12-26 (×2): qty 2, 4d supply, fill #0

## 2021-11-30 ENCOUNTER — Other Ambulatory Visit (HOSPITAL_BASED_OUTPATIENT_CLINIC_OR_DEPARTMENT_OTHER): Payer: Self-pay

## 2021-12-26 ENCOUNTER — Encounter: Payer: Self-pay | Admitting: Family Medicine

## 2021-12-26 ENCOUNTER — Other Ambulatory Visit (HOSPITAL_BASED_OUTPATIENT_CLINIC_OR_DEPARTMENT_OTHER): Payer: Self-pay

## 2021-12-26 ENCOUNTER — Ambulatory Visit: Payer: 59 | Admitting: Family Medicine

## 2021-12-26 ENCOUNTER — Other Ambulatory Visit: Payer: Self-pay

## 2021-12-26 VITALS — BP 130/82 | HR 95 | Temp 98.3°F | Ht 64.0 in | Wt 179.0 lb

## 2021-12-26 DIAGNOSIS — J069 Acute upper respiratory infection, unspecified: Secondary | ICD-10-CM

## 2021-12-26 MED ORDER — AZITHROMYCIN 250 MG PO TABS: ORAL_TABLET | ORAL | 0 refills | Status: AC

## 2021-12-26 MED ORDER — HYDROCOD POLI-CHLORPHE POLI ER 10-8 MG/5ML PO SUER: 5.0000 mL | Freq: Two times a day (BID) | ORAL | 0 refills | Status: DC | PRN

## 2021-12-26 MED ORDER — BENZONATATE 100 MG PO CAPS: 100.0000 mg | ORAL_CAPSULE | Freq: Three times a day (TID) | ORAL | 0 refills | Status: DC | PRN

## 2021-12-26 NOTE — Progress Notes (Signed)
Subjective:     Patient ID: [name] [name], female    DOB: 1971-09-12, 51 y.o.   MRN: [id number]  Chief Complaint  Patient presents with   Cough    Productive, deep cough that started Saturday, causing chest and throat discomfort Has tried Mucinex     HPI Chief complaint: cough, sore throat Symptom onset: 2/25 Pertinent positives: productive cough, harder to breathe-esp hs-heavy chest, hoarseness. Coughing spells. Minimal congestion. No h/o asthma.  H/o bronchitis Pertinent negatives: no f/c. No v/d.  Covid neg sat and sun Treatments tried: mucinex Vaccine status: no covid booster, got flu Sick exposure: none   Health Maintenance Due  Topic Date Due   HIV Screening  Never done   Hepatitis C Screening  Never done   COLONOSCOPY (Pts 45-10yr Insurance coverage will need to be confirmed)  Never done   MAMMOGRAM  05/30/2018   Zoster Vaccines- Shingrix (1 of 2) Never done   COVID-19 Vaccine (4 - Booster) 11/14/2020   PAP SMEAR-Modifier  10/29/2021    Past Medical History:  Diagnosis Date   Arthritis    Genital warts    GERD (gastroesophageal reflux disease)    Hypertension     Past Surgical History:  Procedure Laterality Date   APPENDECTOMY  1998   AND OVARIAN CYSTECTOMY   CESAREAN SECTION  10-15-2019   @[facility]    AND CLOSURE LARGE CYSTOTOMY   DILATION AND CURETTAGE OF UTERUS  11-07-2007  @[facility]    w/ suction for retained placenta   HAND SURGERY Right 09/2019   removal of bone for arthritis   HYSTEROSCOPY N/A 06/22/2020   Procedure: HYSTEROSCOPY;  Surgeon: TBrien Few MD;  [place] [facility]  Service: Gynecology;  Laterality: N/A;   LAPAROTOMY W/ OVARIAN CYSTECTOMY  1999   LYMPH NODE DISSECTION  2015 approx.   per pt upper chest area, stated benign   [place]   Outpatient Medications Prior to Visit  Medication Sig Dispense Refill   amLODipine (NORVASC) 10 MG tablet Take 1 tablet (10  mg total) by mouth daily. 90 tablet 3   diazepam (VALIUM) 5 MG tablet Take one tablet shortly before MRI test; may repeat times 1 2 tablet 0   famotidine (PEPCID) 20 MG tablet Take 20 mg by mouth daily as needed for heartburn or indigestion.     ibuprofen (ADVIL) 800 MG tablet Take 1 tablet (800 mg total) by mouth every 8 (eight) hours as needed. 30 tablet 0   loratadine (CLARITIN) 10 MG tablet Take 10 mg by mouth daily.     tamoxifen (NOLVADEX) 20 MG tablet Take 1 tablet (20 mg total) by mouth daily. 90 tablet 4   valACYclovir (VALTREX) 1000 MG tablet TAKE 2 TABLETS BY MOUTH EVERY 12 (TWELVE) HOURS. TAKE FOR 1 DAY 30 tablet 0   COVID-19 At Home Antigen Test KIT as directed (Patient not taking: Reported on 12/26/2021) 2 kit 0   No facility-administered medications prior to visit.    No Known Allergies ROS neg/noncontributory except as noted HPI/below      Objective:     BP 130/82    Pulse 95    Temp 98.3 F (36.8 C) (Temporal)    Ht 5' 4"  (1.626 m)    Wt 179 lb (81.2 kg)    SpO2 98%    BMI 30.73 kg/m  Wt Readings from Last 3 Encounters:  12/26/21 179 lb (81.2 kg)  09/06/21 180  lb (81.6 kg)  11/10/20 181 lb (82.1 kg)        Gen: WDWN NAD [facility] HEENT: NCAT, conjunctiva not injected, sclera nonicteric TM WNL B, OP moist, no exudates .  Raspy voice NECK:  supple, no thyromegaly, no nodes, no carotid bruits CARDIAC: RRR, S1S2+, no murmur. DP 2+B LUNGS: CTAB. No wheezes.  Good ae EXT:  no edema MSK: no gross abnormalities.  NEURO: A&O x3.  CN II-XII intact.  PSYCH: normal mood. Good eye contact  Assessment & Plan:   Problem List Items Addressed This Visit   None Visit Diagnoses     Upper respiratory tract infection, unspecified type    -  Primary      URI-prob croup.  May be atypical.  Zpk, cough suppressant.  steam  No orders of the defined types were placed in this encounter.   [name] Hampshire, MD

## 2021-12-26 NOTE — Patient Instructions (Signed)
Meds have been sent the the pharmacy You can take tylenol for pain/fevers If worsening symptoms, let us know or go to the Emergency room   Steam room

## 2022-01-16 ENCOUNTER — Other Ambulatory Visit: Payer: Self-pay | Admitting: *Deleted

## 2022-01-16 DIAGNOSIS — Z9189 Other specified personal risk factors, not elsewhere classified: Secondary | ICD-10-CM

## 2022-01-16 DIAGNOSIS — Z1239 Encounter for other screening for malignant neoplasm of breast: Secondary | ICD-10-CM

## 2022-02-09 ENCOUNTER — Other Ambulatory Visit: Payer: 59

## 2022-02-27 ENCOUNTER — Other Ambulatory Visit: Payer: 59

## 2022-03-19 ENCOUNTER — Ambulatory Visit
Admission: RE | Admit: 2022-03-19 | Discharge: 2022-03-19 | Disposition: A | Discharge status: Home / Self Care | Payer: 59 | Source: Ambulatory Visit | Attending: Hematology and Oncology | Admitting: Hematology and Oncology

## 2022-03-19 DIAGNOSIS — Z9189 Other specified personal risk factors, not elsewhere classified: Secondary | ICD-10-CM

## 2022-03-19 DIAGNOSIS — Z1239 Encounter for other screening for malignant neoplasm of breast: Secondary | ICD-10-CM

## 2022-03-19 MED ORDER — GADOBUTROL 1 MMOL/ML IV SOLN
8.0000 mL | Freq: Once | INTRAVENOUS | Status: AC | PRN
  Administered 2022-03-19: 8 mL via INTRAVENOUS

## 2022-05-11 ENCOUNTER — Ambulatory Visit: Payer: 59 | Admitting: Family

## 2022-05-11 ENCOUNTER — Encounter: Payer: Self-pay | Admitting: Family

## 2022-05-11 VITALS — BP 110/81 | HR 95 | Temp 98.9°F | Ht 64.0 in | Wt 180.5 lb

## 2022-05-11 DIAGNOSIS — R053 Chronic cough: Secondary | ICD-10-CM | POA: Diagnosis not present

## 2022-05-11 MED ORDER — BENZONATATE 200 MG PO CAPS: 200.0000 mg | ORAL_CAPSULE | Freq: Three times a day (TID) | ORAL | 0 refills | Status: DC | PRN

## 2022-05-11 MED ORDER — HYDROCOD POLI-CHLORPHE POLI ER 10-8 MG/5ML PO SUER: 5.0000 mL | Freq: Two times a day (BID) | ORAL | 0 refills | Status: DC | PRN

## 2022-05-11 NOTE — Patient Instructions (Signed)
It was very nice to see you today!   I am refilling your Tussionex cough syrup and Tessalon pearles. You can take up to '600mg'$  of Ibuprofen 3 times per day (after eating) or 2 generic Aleve twice a day which helps with the chest pain, decreases inflammation all over thereby helping you feel better. Drink at least 2 liters of water daily. OK to take an over the counter antihistamine to clear up your throat secretions.  Call back next week if cough is not resolving.     PLEASE NOTE:  If you had any lab tests please let us know if you have not heard back within a few days. You may see your results on MyChart before we have a chance to review them but we will give you a call once they are reviewed by [place]. If we ordered any referrals today, please let us know if you have not heard from their office within the next week.

## 2022-05-11 NOTE — Progress Notes (Signed)
Patient ID: [name] [name], female    DOB: 12-16-1970, 51 y.o.   MRN: [id number]  Chief Complaint  Patient presents with   Cough    Pt c/o cough since Tuesday but has progressively has been getting worse. Pt states she had heaviness feeling on her chest. Throat hurts from coughing so much. Negative for Covid at home at 3 am this morning which was negative. Has tried Advil. Panic attacks/ attack with the coughing.    HPI: Persistent cough:  sx started Tuesday, coughing all day causing chest pain. Tested neg for covid at home, reports dtr also sick at home, reports having mild sinus drainage, no fever.   Assessment & Plan:  1. Persistent cough Refilling cough syrup and Tessalon pearles, advised on plenty of water, can increase Claritin to bid to help with throat secretions. Lungs clear today, afebrile, covid test neg at home. Advised on 631m Ibuprofen tid or 2 Aleve bid for pleuritic chest pain.  - chlorpheniramine-HYDROcodone (TUSSIONEX PENNKINETIC ER) 10-8 MG/5ML; Take 5 mLs by mouth every 12 (twelve) hours as needed for cough.  Dispense: 115 mL; Refill: 0 - benzonatate (TESSALON) 200 MG capsule; Take 1 capsule (200 mg total) by mouth 3 (three) times daily as needed.  Dispense: 30 capsule; Refill: 0    Subjective:    Outpatient Medications Prior to Visit  Medication Sig Dispense Refill   amLODipine (NORVASC) 10 MG tablet Take 1 tablet (10 mg total) by mouth daily. 90 tablet 3   COVID-19 At Home Antigen Test KIT as directed 2 kit 0   famotidine (PEPCID) 20 MG tablet Take 20 mg by mouth daily as needed for heartburn or indigestion.     ibuprofen (ADVIL) 800 MG tablet Take 1 tablet (800 mg total) by mouth every 8 (eight) hours as needed. 30 tablet 0   loratadine (CLARITIN) 10 MG tablet Take 10 mg by mouth daily.     tamoxifen (NOLVADEX) 20 MG tablet Take 1 tablet (20 mg total) by mouth daily. 90 tablet 4   valACYclovir (VALTREX) 1000 MG tablet TAKE 2 TABLETS BY MOUTH EVERY 12  (TWELVE) HOURS. TAKE FOR 1 DAY (Patient taking differently: as needed. TAKE 2 TABLETS BY MOUTH EVERY 12 (TWELVE) HOURS. TAKE FOR 1 DAY) 30 tablet 0   diazepam (VALIUM) 5 MG tablet Take one tablet shortly before MRI test; may repeat times 1 (Patient not taking: Reported on 05/11/2022) 2 tablet 0   benzonatate (TESSALON PERLES) 100 MG capsule Take 1 capsule (100 mg total) by mouth 3 (three) times daily as needed. (Patient not taking: Reported on 05/11/2022) 20 capsule 0   chlorpheniramine-HYDROcodone (TUSSIONEX PENNKINETIC ER) 10-8 MG/5ML Take 5 mLs by mouth every 12 (twelve) hours as needed for cough. (Patient not taking: Reported on 05/11/2022) 115 mL 0   No facility-administered medications prior to visit.   Past Medical History:  Diagnosis Date   Arthritis    Genital warts    GERD (gastroesophageal reflux disease)    Hypertension    Past Surgical History:  Procedure Laterality Date   APPENDECTOMY  1998   AND OVARIAN CYSTECTOMY   CESAREAN SECTION  10-15-2019   _0    AND CLOSURE LARGE CYSTOTOMY   DILATION AND CURETTAGE OF UTERUS  11-07-2007  _1    w/ suction for retained placenta   HAND SURGERY Right 09/2019   removal of bone for arthritis   HYSTEROSCOPY N/A 06/22/2020   Procedure: HYSTEROSCOPY;  Surgeon: TBrien Few MD;  [facility]  Service: Gynecology;  Laterality: N/A;   LAPAROTOMY W/ OVARIAN CYSTECTOMY  1999   LYMPH NODE DISSECTION  2015 approx.   per pt upper chest area, stated benign   OVARIAN CYST REMOVAL  1999   TONSILLECTOMY AND ADENOIDECTOMY  1980   No Known Allergies    Objective:    Physical Exam Vitals and nursing note reviewed.  Constitutional:      Appearance: Normal appearance.  HENT:     Right Ear: Tympanic membrane and ear canal normal.     Left Ear: Tympanic membrane and ear canal normal.     Nose:     Right Sinus: No maxillary sinus tenderness or frontal sinus tenderness.     Left Sinus: No maxillary sinus tenderness or  frontal sinus tenderness.     Mouth/Throat:     Mouth: Mucous membranes are moist.     Pharynx: No pharyngeal swelling, oropharyngeal exudate, posterior oropharyngeal erythema or uvula swelling.  Cardiovascular:     Rate and Rhythm: Normal rate and regular rhythm.  Pulmonary:     Effort: Pulmonary effort is normal.     Breath sounds: Normal breath sounds.  Musculoskeletal:        General: Normal range of motion.  Skin:    General: Skin is warm and dry.  Neurological:     Mental Status: She is alert.  Psychiatric:        Mood and Affect: Mood normal.        Behavior: Behavior normal.   BP 110/81 (BP Location: Left Arm, Patient Position: Sitting, Cuff Size: Large)   Pulse 95   Temp 98.9 F (37.2 C) (Temporal)   Ht _0  (1.626 m)   Wt 180 lb 8 oz (81.9 kg)   LMP 05/29/2020   SpO2 96%   BMI 30.98 kg/m  Wt Readings from Last 3 Encounters:  05/11/22 180 lb 8 oz (81.9 kg)  12/26/21 179 lb (81.2 kg)  09/06/21 180 lb (81.6 kg)       Jeanie Sewer, NP

## 2022-07-23 ENCOUNTER — Encounter: Payer: Self-pay | Admitting: *Deleted

## 2022-10-11 ENCOUNTER — Encounter: Payer: Self-pay | Admitting: *Deleted

## 2022-10-26 ENCOUNTER — Other Ambulatory Visit: Payer: Self-pay | Admitting: Family Medicine

## 2022-11-07 ENCOUNTER — Inpatient Hospital Stay: Payer: 59 | Attending: Hematology and Oncology | Admitting: Hematology and Oncology

## 2022-11-07 ENCOUNTER — Encounter: Payer: Self-pay | Admitting: Hematology and Oncology

## 2022-11-07 ENCOUNTER — Other Ambulatory Visit: Payer: Self-pay

## 2022-11-07 VITALS — BP 141/87 | HR 90 | Temp 98.1°F | Resp 16 | Ht 64.0 in | Wt 183.8 lb

## 2022-11-07 DIAGNOSIS — I10 Essential (primary) hypertension: Secondary | ICD-10-CM | POA: Insufficient documentation

## 2022-11-07 DIAGNOSIS — Z87891 Personal history of nicotine dependence: Secondary | ICD-10-CM | POA: Insufficient documentation

## 2022-11-07 DIAGNOSIS — Z1501 Genetic susceptibility to malignant neoplasm of breast: Secondary | ICD-10-CM | POA: Insufficient documentation

## 2022-11-07 DIAGNOSIS — Z1239 Encounter for other screening for malignant neoplasm of breast: Secondary | ICD-10-CM | POA: Diagnosis not present

## 2022-11-07 DIAGNOSIS — Z79810 Long term (current) use of selective estrogen receptor modulators (SERMs): Secondary | ICD-10-CM | POA: Insufficient documentation

## 2022-11-07 DIAGNOSIS — Z803 Family history of malignant neoplasm of breast: Secondary | ICD-10-CM | POA: Diagnosis not present

## 2022-11-07 NOTE — Progress Notes (Signed)
[facility]  [phone] [phone]     ID: [name] DOB: 1971/09/05  MR#: [id number]  SEG#:[id number]  Patient Care Team: Vivi Barrack, MD as PCP - General (Family Medicine) Brien Few, MD as Consulting Physician (Obstetrics and Gynecology) Benay Pike, MD OTHER MD:  CHIEF COMPLAINT: At high risk for breast cancer  CURRENT TREATMENT: Tamoxifen; intensified screening  INTERVAL HISTORY: [name] [name]" was contacted today for follow up of her high risk of breast cancer.  She started tamoxifen on 11/10/2020.  She is tolerating this well with no hot flashes or vaginal wetness.   She had her last MRI in May, negative for malignancy.  Mammogram in Sep, negative for malignancy. She denies any breast changes today  REVIEW OF SYSTEMS: A detailed review of systems today was otherwise noncontributory   COVID 19 VACCINATION STATUS: Status post Pfizer x2 with booster November 2021   HISTORY OF CURRENT ILLNESS: From the original intake note:  [name] [name] has a strong family history of breast cancer.  This was noted by Dr.Bertrand at the time of mammography and the patient was referred for further evaluation and treatment.  She had an abbreviated breast MRI on 07/13/2019 showing breast density C.  There were no masses or abnormal areas of enhancement and no evidence of adenopathy.  The patient's subsequent history is as detailed below.   PAST MEDICAL HISTORY: Past Medical History:  Diagnosis Date   Arthritis    Genital warts    GERD (gastroesophageal reflux disease)    Hypertension     PAST SURGICAL HISTORY: Past Surgical History:  Procedure Laterality Date   APPENDECTOMY  1998   AND OVARIAN CYSTECTOMY   CESAREAN SECTION  10-15-2019   '@[facility]'$    AND CLOSURE LARGE CYSTOTOMY   DILATION AND CURETTAGE OF UTERUS  11-07-2007  '@[facility]'$    w/ suction for retained placenta   HAND SURGERY Right 09/2019   removal of bone for  arthritis   HYSTEROSCOPY N/A 06/22/2020   Procedure: HYSTEROSCOPY;  Surgeon: Brien Few, MD;  Location: [facility];  Service: Gynecology;  Laterality: N/A;   LAPAROTOMY W/ OVARIAN CYSTECTOMY  1999   LYMPH NODE DISSECTION  2015 approx.   per pt upper chest area, stated benign   OVARIAN CYST REMOVAL  1999   TONSILLECTOMY AND ADENOIDECTOMY  1980    FAMILY HISTORY: Family History  Problem Relation Age of Onset   Hyperlipidemia Mother    Cancer Mother        Breast   Hypertension Mother    Heart disease Mother    Osteoarthritis Mother    Hyperlipidemia Father    Hypertension Father    Hearing loss Father    Gout Father    Stroke Paternal Grandmother    Birth defects Brother        CP   Heart attack Maternal Grandmother    Alcohol abuse Paternal Uncle   As of January 2022 the patient's father is 14 years old.  The patient has 2 first cousins on the side of the family both with breast cancer in their 54s.  The patient's mother is 41 years old as of January 2022.  She was diagnosed with breast cancer at age 23.  She had 2 sisters who developed breast cancer and died by age 20.  The patient is not aware of any ovarian, pancreatic, or prostate cancer in the family.  She is not aware of any uterine or colon cancers in the  family   GYNECOLOGIC HISTORY:  Patient's last menstrual period was 05/29/2020. Menarche: 52 years old Age at first live birth: 52 years old Archbald P twins LMP age 40 Contraceptive stopped Mirena 2021, remote oral contraceptives HRT no  Hysterectomy? no BSO? 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  She also sometimes works at [employer], which she enjoys . She graduated from [employer] in child development.  Her husband [name] (goes by [name] [name]") works for [employer].  Their twins are 54 years old.  Family attends [employer]    ADVANCED DIRECTIVES: In the absence of any documents to the contrary the patient's husband is her healthcare power of attorney   HEALTH MAINTENANCE: Social History   Tobacco Use   Smoking status: Former    Years: 2.00    Types: Cigarettes    Quit date: 06/16/1987    Years since quitting: 35.4   Smokeless tobacco: Never  Vaping Use   Vaping Use: Never used  Substance Use Topics   Alcohol use: Yes    Alcohol/week: 21.0 standard drinks of alcohol    Types: 21 Glasses of wine per week    Comment: per pt 3 wine daily   Drug use: Never     Colonoscopy:   PAP: 2020, negative  Bone density:    No Known Allergies  Current Outpatient Medications  Medication Sig Dispense Refill   amLODipine (NORVASC) 10 MG tablet TAKE 1 TABLET BY MOUTH EVERY DAY 30 tablet 0   benzonatate (TESSALON) 200 MG capsule Take 1 capsule (200 mg total) by mouth 3 (three) times daily as needed. 30 capsule 0   chlorpheniramine-HYDROcodone (TUSSIONEX PENNKINETIC ER) 10-8 MG/5ML Take 5 mLs by mouth every 12 (twelve) hours as needed for cough. 115 mL 0   COVID-19 At Home Antigen Test KIT as directed 2 kit 0   diazepam (VALIUM) 5 MG tablet Take one tablet shortly before MRI test; may repeat times 1 (Patient not taking: Reported on 05/11/2022) 2 tablet 0   famotidine (PEPCID) 20 MG tablet Take 20 mg by mouth daily as needed for heartburn or indigestion.     ibuprofen (ADVIL) 800 MG tablet Take 1 tablet (800 mg total) by mouth every 8 (eight) hours as needed. 30 tablet 0   loratadine (CLARITIN) 10 MG tablet Take 10 mg by mouth daily.     tamoxifen (NOLVADEX) 20 MG tablet Take 1 tablet (20 mg total) by mouth daily. 90 tablet 4   valACYclovir (VALTREX) 1000 MG tablet TAKE 2 TABLETS BY MOUTH EVERY 12 (TWELVE) HOURS. TAKE FOR 1 DAY (Patient taking differently: as needed. TAKE 2 TABLETS BY MOUTH EVERY 12 (TWELVE) HOURS. TAKE FOR 1 DAY) 30 tablet 0   No current facility-administered medications for this visit.     OBJECTIVE: White woman who appears well  Vitals:   11/07/22 1533  BP: (!) 141/87  [name]: 90  Resp: 16  Temp: 98.1 F (36.7 C)  SpO2: 98%     Body mass index is 31.55 kg/m.   Wt Readings from Last 3 Encounters:  11/07/22 183 lb 12.8 oz (83.4 kg)  05/11/22 180 lb 8 oz (81.9 kg)  12/26/21 179 lb (81.2 kg)      ECOG FS:1 - Symptomatic but completely ambulatory  Physical Exam Constitutional:      Appearance: Normal appearance.  Cardiovascular:  Rate and Rhythm: Normal rate and regular rhythm.  Pulmonary:     Effort: Pulmonary effort is normal.     Breath sounds: Normal breath sounds.  Chest:     Comments: Bilateral breasts inspected. No palpable masses or regional adenopathy Musculoskeletal:        General: No swelling.     Cervical back: Normal range of motion and neck supple. No rigidity.  Lymphadenopathy:     Cervical: No cervical adenopathy.  Skin:    General: Skin is warm and dry.  Neurological:     General: No focal deficit present.     Mental Status: She is alert.     LAB RESULTS:  CMP     Component Value Date/Time   NA 138 09/06/2021 0935   K 4.2 09/06/2021 0935   CL 106 09/06/2021 0935   CO2 26 09/06/2021 0935   GLUCOSE 95 09/06/2021 0935   GLUCOSE  10/07/2007 0640    79 CORRECTED ON 12/09 AT 0924: PREVIOUSLY REPORTED AS 133   BUN 15 09/06/2021 0935   CREATININE 0.76 09/06/2021 0935   CALCIUM 9.1 09/06/2021 0935   PROT 7.1 09/06/2021 0935   ALBUMIN 4.5 09/06/2021 0935   AST 26 09/06/2021 0935   ALT 25 09/06/2021 0935   ALKPHOS 34 (L) 09/06/2021 0935   BILITOT 0.6 09/06/2021 0935   GFRNONAA >60 06/20/2020 1018   GFRAA >60 06/20/2020 1018    No results found for: "TOTALPROTELP", "ALBUMINELP", "A1GS", "A2GS", "BETS", "BETA2SER", "GAMS", "MSPIKE", "SPEI"  Lab Results  Component Value Date   WBC 2.7 (L) 09/06/2021   NEUTROABS 6.6 11/07/2007   HGB 12.8 09/06/2021   HCT 38.3 09/06/2021   MCV 89.4 09/06/2021   PLT 240.0 09/06/2021     No results found for: "LABCA2"  No components found for: "ESPQZR007"  No results for input(s): "INR" in the last 168 hours.  No results found for: "LABCA2"  No results found for: "MAU633"  No results found for: "CAN125"  No results found for: "CAN153"  No results found for: "CA2729"  No components found for: "HGQUANT"  No results found for: "CEA1", "CEA" / No results found for: "CEA1", "CEA"   No results found for: "AFPTUMOR"  No results found for: "CHROMOGRNA"  No results found for: "KPAFRELGTCHN", "LAMBDASER", "KAPLAMBRATIO" (kappa/lambda light chains)  No results found for: "HGBA", "HGBA2QUANT", "HGBFQUANT", "HGBSQUAN" (Hemoglobinopathy evaluation)   No results found for: "LDH"  No results found for: "IRON", "TIBC", "IRONPCTSAT" (Iron and TIBC)  No results found for: "FERRITIN"  Urinalysis    Component Value Date/Time   COLORURINE YELLOW 02/11/2020 1401   APPEARANCEUR Sl Cloudy (A) 02/11/2020 1401   LABSPEC 1.025 02/11/2020 1401   PHURINE 5.5 02/11/2020 1401   GLUCOSEU NEGATIVE 02/11/2020 1401   HGBUR NEGATIVE 02/11/2020 1401   BILIRUBINUR NEGATIVE 02/11/2020 1401   BILIRUBINUR Negative 09/17/2017 1040   KETONESUR 15 (A) 02/11/2020 1401   PROTEINUR Negative 09/17/2017 1040   UROBILINOGEN 0.2 02/11/2020 1401   NITRITE NEGATIVE 02/11/2020 1401   LEUKOCYTESUR NEGATIVE 02/11/2020 1401     STUDIES: No results found.   ELIGIBLE FOR AVAILABLE RESEARCH PROTOCOL: AET  ASSESSMENT: 52 y.o. [place] woman with a lifetime risk of breast cancer in excess of 30%  (1) risk reduction: Tamoxifen started 11/10/2020  (2) intensified screening:  (a) abbreviated breast MRI yearly in September  (b) bilateral mammography yearly in March  (c) yearly MD breast exam   PLAN:  [name] [name] is on tamoxifen for risk reduction, tolerating that well. The  plan is to continue for a total of 5 years. No concerns on physical examination today. Bilateral breasts inspected and  palpated.  We have once again discussed about AE of tamoxifen including DVT/PE, endometrial issues. She was asked to promptly call us with any swelling, pain of LE, chest pain, SOB or vaginal bleeding MRI due in May, would prefer to postpone mammogram to November RTC in one yr or sooner as needed.  Total time spent: 30 min  *Total Encounter Time as defined by the Centers for Medicare and Medicaid Services includes, in addition to the face-to-face time of a patient visit (documented in the note above) non-face-to-face time: obtaining and reviewing outside history, ordering and reviewing medications, tests or procedures, care coordination (communications with other health care professionals or caregivers) and documentation in the medical record.

## 2022-11-16 ENCOUNTER — Encounter: Payer: Self-pay | Admitting: Hematology and Oncology

## 2022-11-16 ENCOUNTER — Other Ambulatory Visit: Payer: Self-pay | Admitting: *Deleted

## 2022-11-16 MED ORDER — TAMOXIFEN CITRATE 20 MG PO TABS: 20.0000 mg | ORAL_TABLET | Freq: Every day | ORAL | 4 refills | Status: DC

## 2022-11-25 ENCOUNTER — Other Ambulatory Visit: Payer: Self-pay | Admitting: Family Medicine

## 2022-12-21 ENCOUNTER — Other Ambulatory Visit: Payer: Self-pay | Admitting: Family Medicine

## 2022-12-25 ENCOUNTER — Other Ambulatory Visit: Payer: Self-pay | Admitting: Family Medicine

## 2023-01-18 ENCOUNTER — Encounter: Payer: Self-pay | Admitting: Family Medicine

## 2023-01-18 ENCOUNTER — Ambulatory Visit: Payer: 59 | Admitting: Family Medicine

## 2023-01-18 VITALS — BP 137/88 | HR 77 | Temp 98.0°F | Ht 64.0 in | Wt 183.2 lb

## 2023-01-18 DIAGNOSIS — R7303 Prediabetes: Secondary | ICD-10-CM | POA: Insufficient documentation

## 2023-01-18 DIAGNOSIS — E785 Hyperlipidemia, unspecified: Secondary | ICD-10-CM

## 2023-01-18 DIAGNOSIS — Z9189 Other specified personal risk factors, not elsewhere classified: Secondary | ICD-10-CM

## 2023-01-18 DIAGNOSIS — R739 Hyperglycemia, unspecified: Secondary | ICD-10-CM | POA: Diagnosis not present

## 2023-01-18 DIAGNOSIS — B001 Herpesviral vesicular dermatitis: Secondary | ICD-10-CM

## 2023-01-18 DIAGNOSIS — Z23 Encounter for immunization: Secondary | ICD-10-CM | POA: Diagnosis not present

## 2023-01-18 DIAGNOSIS — I10 Essential (primary) hypertension: Secondary | ICD-10-CM

## 2023-01-18 LAB — COMPREHENSIVE METABOLIC PANEL
ALT: 23 U/L (ref 0–35)
AST: 24 U/L (ref 0–37)
Albumin: 4.4 g/dL (ref 3.5–5.2)
Alkaline Phosphatase: 42 U/L (ref 39–117)
BUN: 14 mg/dL (ref 6–23)
CO2: 26 mEq/L (ref 19–32)
Calcium: 9.2 mg/dL (ref 8.4–10.5)
Chloride: 106 mEq/L (ref 96–112)
Creatinine, Ser: 0.81 mg/dL (ref 0.40–1.20)
GFR: 82.8 mL/min (ref >60.00)
Glucose, Bld: 105 mg/dL — ABNORMAL HIGH (ref 70–99)
Potassium: 4.4 mEq/L (ref 3.5–5.1)
Sodium: 140 mEq/L (ref 135–145)
Total Bilirubin: 0.6 mg/dL (ref 0.2–1.2)
Total Protein: 6.9 g/dL (ref 6.0–8.3)

## 2023-01-18 LAB — LIPID PANEL
Cholesterol: 238 mg/dL — ABNORMAL HIGH (ref 0–200)
HDL: 77.9 mg/dL (ref >39.00)
LDL Cholesterol: 136 mg/dL — ABNORMAL HIGH (ref 0–99)
NonHDL: 159.69
Total CHOL/HDL Ratio: 3
Triglycerides: 117 mg/dL (ref 0.0–149.0)
VLDL: 23.4 mg/dL (ref 0.0–40.0)

## 2023-01-18 LAB — CBC
HCT: 39.5 % (ref 36.0–46.0)
Hemoglobin: 13.2 g/dL (ref 12.0–15.0)
MCHC: 33.5 g/dL (ref 30.0–36.0)
MCV: 89.6 fl (ref 78.0–100.0)
Platelets: 241 10*3/uL (ref 150.0–400.0)
RBC: 4.41 Mil/uL (ref 3.87–5.11)
RDW: 14.6 % (ref 11.5–15.5)
WBC: 3.4 10*3/uL — ABNORMAL LOW (ref 4.0–10.5)

## 2023-01-18 LAB — TSH: TSH: 3.3 u[IU]/mL (ref 0.35–5.50)

## 2023-01-18 LAB — HEMOGLOBIN A1C: Hgb A1c MFr Bld: 5.7 % (ref 4.6–6.5)

## 2023-01-18 MED ORDER — VALACYCLOVIR HCL 1 G PO TABS: ORAL_TABLET | ORAL | 0 refills | Status: DC

## 2023-01-18 MED ORDER — AMLODIPINE BESYLATE 10 MG PO TABS: 10.0000 mg | ORAL_TABLET | Freq: Every day | ORAL | 3 refills | Status: DC

## 2023-01-18 MED ORDER — DIAZEPAM 5 MG PO TABS: ORAL_TABLET | ORAL | 0 refills | Status: AC

## 2023-01-18 NOTE — Progress Notes (Signed)
Please inform patient of the following:  Cholesterol and blood sugar both borderline but stable compared to previous.  Do not need to start meds but she should work on diet and exercise.  All of her other labs were normal.  We can recheck everything in a year.

## 2023-01-18 NOTE — Progress Notes (Signed)
   [name] [name] is a 52 y.o. female who presents today for an office visit.  Assessment/Plan:  Chronic Problems Addressed Today: Essential hypertension Above goal today.  Typically well-controlled on amlodipine 10 mg daily though she has been off of this for the last week or so.  Will refill today.  She will continue to monitor at home and let us know if blood pressure does not return to normal.  Will check labs today.  We discussed lifestyle modifications.  Herpes labialis On Valtrex 2000 mg every 12 hours for 1 day as needed for outbreak.  Did have a recent flare.  Refill Valtrex today.  At high risk for breast cancer Follows with oncology for this.  On tamoxifen.  Uses Valium as needed prior to her yearly MRI.  Dyslipidemia Discussed lifestyle modifications.  Check lipids.  Preventative Healthcare She recently had colonoscopy and Pap smear performed.  Will obtain these records.  Check labs today as above.  First shingles vaccine given today.     Subjective:  HPI:  See A/p for status of chronic conditions.  Her main concern today is elevated blood pressure.  She has been out of blood pressure meds for a month.  She had previously been tolerating well.  No significant side effects.  She feels like she is overall eating healthy diet though does admit she could be more active.       Objective:  Physical Exam: BP 137/88   Pulse 77   Temp 98 F (36.7 C) (Temporal)   Ht 5\' 4"  (1.626 m)   Wt 183 lb 3.2 oz (83.1 kg)   LMP 05/29/2020   SpO2 97%   BMI 31.45 kg/m   Gen: No acute distress, resting comfortably CV: Regular rate and rhythm with no murmurs appreciated Pulm: Normal work of breathing, clear to auscultation bilaterally with no crackles, wheezes, or rhonchi Neuro: Grossly normal, moves all extremities Psych: Normal affect and thought content      [name] Vanduyn M. Jerline Pain, MD 01/18/2023 8:41 AM

## 2023-01-18 NOTE — Assessment & Plan Note (Signed)
Discussed lifestyle modifications. Check lipids.  ?

## 2023-01-18 NOTE — Assessment & Plan Note (Signed)
Above goal today.  Typically well-controlled on amlodipine 10 mg daily though she has been off of this for the last week or so.  Will refill today.  She will continue to monitor at home and let us know if blood pressure does not return to normal.  Will check labs today.  We discussed lifestyle modifications.

## 2023-01-18 NOTE — Assessment & Plan Note (Signed)
On Valtrex 2000 mg every 12 hours for 1 day as needed for outbreak.  Did have a recent flare.  Refill Valtrex today.

## 2023-01-18 NOTE — Patient Instructions (Signed)
It was very nice to see you today!  I will refill your medications today.  We will check blood work today.  Please continue to work on diet and exercise.  We will see you back in a year.  Please come back sooner if needed.  Take care, Dr Jerline Pain  PLEASE NOTE:  If you had any lab tests, please let us know if you have not heard back within a few days. You may see your results on mychart before we have a chance to review them but we will give you a call once they are reviewed by [place].   If we ordered any referrals today, please let us know if you have not heard from their office within the next week.   If you had any urgent prescriptions sent in today, please check with the pharmacy within an hour of our visit to make sure the prescription was transmitted appropriately.   Please try these tips to maintain a healthy lifestyle:  Eat at least 3 REAL meals and 1-2 snacks per day.  Aim for no more than 5 hours between eating.  If you eat breakfast, please do so within one hour of getting up.   Each meal should contain half fruits/vegetables, one quarter protein, and one quarter carbs (no bigger than a computer mouse)  Cut down on sweet beverages. This includes juice, soda, and sweet tea.   Drink at least 1 glass of water with each meal and aim for at least 8 glasses per day  Exercise at least 150 minutes every week.

## 2023-01-18 NOTE — Assessment & Plan Note (Signed)
Follows with oncology for this.  On tamoxifen.  Uses Valium as needed prior to her yearly MRI.

## 2023-01-18 NOTE — Addendum Note (Signed)
Addended by: Betti Cruz on: 01/18/2023 10:06 AM   Modules accepted: Orders

## 2023-01-25 ENCOUNTER — Other Ambulatory Visit: Payer: Self-pay | Admitting: Family Medicine

## 2023-03-05 ENCOUNTER — Ambulatory Visit (HOSPITAL_COMMUNITY)
Admission: RE | Admit: 2023-03-05 | Discharge: 2023-03-05 | Disposition: A | Discharge status: Home / Self Care | Payer: 59 | Source: Ambulatory Visit | Attending: Hematology and Oncology | Admitting: Hematology and Oncology

## 2023-03-05 DIAGNOSIS — Z1239 Encounter for other screening for malignant neoplasm of breast: Secondary | ICD-10-CM | POA: Diagnosis not present

## 2023-03-05 MED ORDER — GADOBUTROL 1 MMOL/ML IV SOLN
8.0000 mL | Freq: Once | INTRAVENOUS | Status: AC | PRN
  Administered 2023-03-05: 8 mL via INTRAVENOUS

## 2023-07-30 LAB — HM MAMMOGRAPHY

## 2023-08-02 ENCOUNTER — Other Ambulatory Visit: Payer: Self-pay | Admitting: Family Medicine

## 2023-08-02 DIAGNOSIS — Z1211 Encounter for screening for malignant neoplasm of colon: Secondary | ICD-10-CM

## 2023-08-02 DIAGNOSIS — Z1212 Encounter for screening for malignant neoplasm of rectum: Secondary | ICD-10-CM

## 2023-08-14 ENCOUNTER — Other Ambulatory Visit: Payer: Self-pay

## 2023-08-15 LAB — SURGICAL PATHOLOGY

## 2023-09-07 ENCOUNTER — Telehealth: Payer: 59 | Admitting: Physician Assistant

## 2023-09-07 DIAGNOSIS — Z87898 Personal history of other specified conditions: Secondary | ICD-10-CM

## 2023-09-07 MED ORDER — OXYMETAZOLINE HCL 0.05 % NA SOLN: 1.0000 | Freq: Two times a day (BID) | NASAL | Status: AC

## 2023-09-07 NOTE — Patient Instructions (Signed)
  Bertram Gala, thank you for joining [name], PA-C for today's virtual visit.  While this provider is not your primary care provider (PCP), if your PCP is located in our provider database this encounter information will be shared with them immediately following your visit.   A [facility] MyChart account gives you access to today's visit and all your visits, tests, and labs performed at [facility] " click here if you don't have a [facility] MyChart account or go to [email or web address]  Consent: (Patient) [name] [name] [facility] provided verbal consent for this virtual visit at the beginning of the encounter.  Current Medications:  Current Outpatient Medications:    amLODipine (NORVASC) 10 MG tablet, Take 1 tablet (10 mg total) by mouth daily., Disp: 90 tablet, Rfl: 3   diazepam (VALIUM) 5 MG tablet, Take one tablet shortly before MRI test; may repeat times 1, Disp: 2 tablet, Rfl: 0   famotidine (PEPCID) 20 MG tablet, Take 20 mg by mouth daily as needed for heartburn or indigestion., Disp: , Rfl:    ibuprofen (ADVIL) 800 MG tablet, Take 1 tablet (800 mg total) by mouth every 8 (eight) hours as needed., Disp: 30 tablet, Rfl: 0   loratadine (CLARITIN) 10 MG tablet, Take 10 mg by mouth daily., Disp: , Rfl:    tamoxifen (NOLVADEX) 20 MG tablet, Take 1 tablet (20 mg total) by mouth daily., Disp: 90 tablet, Rfl: 4   valACYclovir (VALTREX) 1000 MG tablet, TAKE 2 TABLETS BY MOUTH EVERY 12 (TWELVE) HOURS. TAKE FOR 1 DAY, Disp: 180 tablet, Rfl: 0   Medications ordered in this encounter:  No orders of the defined types were placed in this encounter.    *If you need refills on other medications prior to your next appointment, please contact your pharmacy*  Follow-Up: Call back or seek an in-person evaluation if the symptoms worsen or if the condition fails to improve as anticipated.  [facility] [phone]  Other Instructions Use Affrin as indicated on  packaging. Report to ER or Urgent Care if symptoms do not improve.    If you have been instructed to have an in-person evaluation today at a local Urgent Care facility, please use the link below. It will take you to a list of all of our available [facility], including address, phone number and hours of operation. Please do not delay care.  [facility]  If you or a family member do not have a primary care provider, use the link below to schedule a visit and establish care. When you choose a [facility] primary care physician or advanced practice provider, you gain a long-term partner in health. Find a Primary Care Provider  Learn more about [facility]'s in-office and virtual care options: [facility] - Get Care Now

## 2023-09-07 NOTE — Progress Notes (Signed)
Virtual Visit Consent   [name] [facility], you are scheduled for a virtual visit with a [facility] provider today. Just as with appointments in the office, your consent must be obtained to participate. Your consent will be active for this visit and any virtual visit you may have with one of our providers in the next 365 days. If you have a MyChart account, a copy of this consent can be sent to you electronically.  As this is a virtual visit, video technology does not allow for your provider to perform a traditional examination. This may limit your provider's ability to fully assess your condition. If your provider identifies any concerns that need to be evaluated in person or the need to arrange testing (such as labs, EKG, etc.), we will make arrangements to do so. Although advances in technology are sophisticated, we cannot ensure that it will always work on either your end or our end. If the connection with a video visit is poor, the visit may have to be switched to a telephone visit. With either a video or telephone visit, we are not always able to ensure that we have a secure connection.  By engaging in this virtual visit, you consent to the provision of healthcare and authorize for your insurance to be billed (if applicable) for the services provided during this visit. Depending on your insurance coverage, you may receive a charge related to this service.  I need to obtain your verbal consent now. Are you willing to proceed with your visit today? Zanetta Pol [facility] has provided verbal consent on 09/07/2023 for a virtual visit (video or telephone). [name] [name], [place]  Date: 09/07/2023 4:01 PM  Virtual Visit via Video Note   I, [name] [name], connected with  Roxianne Southward  ([id number], 1970-11-04) on 09/07/23 at  4:00 PM EST by a video-enabled telemedicine application and verified that I am speaking with the correct person using two identifiers.  Location: Patient: Virtual Visit Location  Patient: Home Provider: Virtual Visit Location Provider: Home Office   I discussed the limitations of evaluation and management by telemedicine and the availability of in person appointments. The patient expressed understanding and agreed to proceed.    History of Present Illness: [name] [name] is a 52 y.o. who identifies as a female who was assigned female at birth, and is being seen today for previous epistaxis now resolved at the right nostril.  HPI: Epistaxis  The bleeding has been from the right nare. This is a new problem. The current episode started today. The problem occurs every few hours. The problem has been resolved. The bleeding is associated with nothing. She has tried ice and pressure for the symptoms. The treatment provided significant relief. Her past medical history is significant for allergies.    Problems:  Patient Active Problem List   Diagnosis Date Noted   Prediabetes 01/18/2023   Dyslipidemia 09/06/2021   Breast cancer screening, high risk patient 11/10/2020   At high risk for breast cancer 11/10/2020   Herpes labialis 03/14/2018   GERD (gastroesophageal reflux disease) 09/17/2017   Essential hypertension 09/17/2017    Allergies: No Known Allergies Medications:  Current Outpatient Medications:    amLODipine (NORVASC) 10 MG tablet, Take 1 tablet (10 mg total) by mouth daily., Disp: 90 tablet, Rfl: 3   diazepam (VALIUM) 5 MG tablet, Take one tablet shortly before MRI test; may repeat times 1, Disp: 2 tablet, Rfl: 0   famotidine (PEPCID) 20 MG tablet, Take 20 mg by  mouth daily as needed for heartburn or indigestion., Disp: , Rfl:    ibuprofen (ADVIL) 800 MG tablet, Take 1 tablet (800 mg total) by mouth every 8 (eight) hours as needed., Disp: 30 tablet, Rfl: 0   loratadine (CLARITIN) 10 MG tablet, Take 10 mg by mouth daily., Disp: , Rfl:    tamoxifen (NOLVADEX) 20 MG tablet, Take 1 tablet (20 mg total) by mouth daily., Disp: 90 tablet, Rfl: 4   valACYclovir  (VALTREX) 1000 MG tablet, TAKE 2 TABLETS BY MOUTH EVERY 12 (TWELVE) HOURS. TAKE FOR 1 DAY, Disp: 180 tablet, Rfl: 0  Observations/Objective: Patient is well-developed, well-nourished in no acute distress.  Resting comfortably  at home.  Head is normocephalic, atraumatic.  No labored breathing.  Speech is clear and coherent with logical content.  Patient is alert and oriented at baseline.  No active epistaxis at this time.    Assessment and Plan: 1. History of epistaxis  Patient with 2-3 episodes of epistaxis on today now resolved. Counseled on management and provided affrin should another episode occur. Also advised that if her symptoms return, she should report to ER for evaluation.   Follow Up Instructions: I discussed the assessment and treatment plan with the patient. The patient was provided an opportunity to ask questions and all were answered. The patient agreed with the plan and demonstrated an understanding of the instructions.  A copy of instructions were sent to the patient via MyChart unless otherwise noted below.    The patient was advised to call back or seek an in-person evaluation if the symptoms worsen or if the condition fails to improve as anticipated.    [name] Kidney, PA-C

## 2023-09-11 ENCOUNTER — Emergency Department (HOSPITAL_BASED_OUTPATIENT_CLINIC_OR_DEPARTMENT_OTHER)
Admission: EM | Admit: 2023-09-11 | Discharge: 2023-09-11 | Disposition: A | Discharge status: Home / Self Care | Payer: 59 | Attending: Emergency Medicine | Admitting: Emergency Medicine

## 2023-09-11 ENCOUNTER — Encounter (HOSPITAL_BASED_OUTPATIENT_CLINIC_OR_DEPARTMENT_OTHER): Payer: Self-pay | Admitting: Emergency Medicine

## 2023-09-11 ENCOUNTER — Other Ambulatory Visit: Payer: Self-pay

## 2023-09-11 DIAGNOSIS — R040 Epistaxis: Secondary | ICD-10-CM

## 2023-09-11 DIAGNOSIS — I10 Essential (primary) hypertension: Secondary | ICD-10-CM | POA: Diagnosis not present

## 2023-09-11 MED ORDER — SILVER NITRATE-POT NITRATE 75-25 % EX MISC
1.0000 | Freq: Once | CUTANEOUS | Status: AC
  Administered 2023-09-11: 1 via TOPICAL
  Filled 2023-09-11: qty 10

## 2023-09-11 MED ORDER — LIDOCAINE-EPINEPHRINE (PF) 2 %-1:200000 IJ SOLN
INTRAMUSCULAR | Status: DC
Start: 2023-09-11 — End: 2023-09-11
  Filled 2023-09-11: qty 20

## 2023-09-11 MED ORDER — LIDOCAINE-EPINEPHRINE 2 %-1:100000 IJ SOLN
20.0000 mL | Freq: Once | INTRAMUSCULAR | Status: AC
Start: 2023-09-11 — End: 2023-09-11
  Administered 2023-09-11: 20 mL

## 2023-09-11 NOTE — ED Provider Notes (Signed)
Emergency Department Provider Note   I have reviewed the triage vital signs and the nursing notes.   HISTORY  Chief Complaint Epistaxis   HPI [name] [name] is a 52 y.o. female with past history reviewed presents to the emergency department with continued bleeding from the right nostril.  She was treated at urgent care earlier today with silver nitrate.  This seemed to improve her symptoms but the nose had breakthrough bleeding in the afternoon.  She uses saline nasal spray daily.  No prior history of epistaxis.  She has not anticoagulated.  She is not having fatigue or shortness of breath.   Past Medical History:  Diagnosis Date   Arthritis    Genital warts    GERD (gastroesophageal reflux disease)    Hypertension     Review of Systems  Constitutional: No fever/chills ENT: Positive epistaxis.  Cardiovascular: Denies chest pain. Respiratory: Denies shortness of breath. Gastrointestinal: No abdominal pain.  No nausea, no vomiting.  Musculoskeletal: Negative for back pain. Skin: Negative for rash. Neurological: Negative for headaches.  ____________________________________________   PHYSICAL EXAM:  VITAL SIGNS: ED Triage Vitals  Encounter Vitals Group     BP 09/11/23 1333 (!) 145/91     Pulse Rate 09/11/23 1333 (!) 112     Resp 09/11/23 1333 16     Temp 09/11/23 1333 97.6 F (36.4 C)     Temp src --      SpO2 09/11/23 1333 98 %     Weight 09/11/23 1335 185 lb (83.9 kg)   Constitutional: Alert and oriented. Well appearing and in no acute distress. Eyes: Conjunctivae are normal.  Head: Atraumatic. Nose: No congestion/rhinnorhea.  Right nostril visualized.  There is silver nitrate staining to the nasal septum in the right nostril.  I do see several bright red areas that appear to be from some breakthrough bleeding.  No active bleeding on my assessment. Mouth/Throat: Mucous membranes are moist.  Neck: No stridor.   Cardiovascular: Good peripheral  circulation. Respiratory: Normal respiratory effort.  Gastrointestinal: No distention.  Musculoskeletal: No gross deformities of extremities. Neurologic:  Normal speech and language.  Skin:  Skin is warm, dry and intact. No rash noted.  ____________________________________________   PROCEDURES  Procedure(s) performed:   .Epistaxis Management  Date/Time: 09/11/2023 3:41 PM  Performed by: Maia Plan, MD Authorized by: Maia Plan, MD   Consent:    Consent obtained:  Verbal   Consent given by:  Patient   Risks, benefits, and alternatives were discussed: yes     Risks discussed:  Infection, bleeding, pain and nasal injury   Alternatives discussed:  No treatment Universal protocol:    Patient identity confirmed:  Verbally with patient Anesthesia:    Anesthesia method:  Topical application   Topical anesthetic:  Epinephrine and lidocaine gel Procedure details:    Treatment site:  R anterior   Treatment method:  Silver nitrate   Treatment complexity:  Limited   Treatment episode: recurring   Post-procedure details:    Assessment:  No improvement   Procedure completion:  Tolerated well, no immediate complications   ____________________________________________   INITIAL IMPRESSION / ASSESSMENT AND PLAN / ED COURSE  Pertinent labs & imaging results that were available during my care of the patient were reviewed by me and considered in my medical decision making (see chart for details).   This patient is Presenting for Evaluation of epistaxis, which does require a range of treatment options, and is a complaint that involves  a moderate risk of morbidity and mortality.  The Differential Diagnoses include anterior epistaxis, posterior epistaxis, .  Critical Interventions-    Medications  lidocaine-EPINEPHrine (XYLOCAINE W/EPI) 2 %-1:200000 (PF) injection (has no administration in time range)  lidocaine-EPINEPHrine (XYLOCAINE W/EPI) 2 %-1:100000 (with pres) injection  20 mL (20 mLs Infiltration Given by Other 09/11/23 1522)  silver nitrate applicators applicator 1 Application (1 Application Topical Given by Other 09/11/23 1523)    Reassessment after intervention: no breakthrough bleeding.    Medical Decision Making: Summary:  Patient presents emergency department with right side epistaxis which is recurrent.  No active bleeding but I can see areas that appear to have been recently bleeding within the territory already cauterized by urgent care.  Plan for lidocaine/epinephrine and silver nitrate additional, punctate areas.   Reevaluation with update and discussion with   ***Considered admission***  Patient's presentation is most consistent with acute presentation with potential threat to life or bodily function.   Disposition:   ____________________________________________  FINAL CLINICAL IMPRESSION(S) / ED DIAGNOSES  Final diagnoses:  None     NEW OUTPATIENT MEDICATIONS STARTED DURING THIS VISIT:  New Prescriptions   No medications on file    Note:  This document was prepared using Dragon voice recognition software and may include unintentional dictation errors.  Alona Bene, MD, [facility]

## 2023-09-11 NOTE — ED Triage Notes (Signed)
Pt c/o RT nares epistaxis on Saturday, after BM on Monday and today pta. Denies injury, denies thinners. TX at [facility] today, bleeding started again ~ 1 hr after tx. No bleeding noted in triage

## 2023-09-11 NOTE — Discharge Instructions (Signed)
As we discussed, there are several techniques you can use to prevent or stop nosebleeds in the future.  Keep your nose moist either with saline spray several times a day or by applying a thin layer of Neosporin, bacitracin, or other antibiotic ointment to the inside of your nose once or twice a day.  If the bleeding starts up again, gently blow your nose into a tissue to clear the blood and clots, then apply 1-2 sprays to each affected nostril of over-the-counter Afrin nasal spray (oxymetazoline).   Then squeeze your nose shut tightly and DO NOT PEEK for at least 15 minutes.  This will resolve most nosebleeds.  If you continue to have trouble after trying these techniques, or anything seems out of the ordinary or concerns you, please return tot he Emergency Department.  

## 2023-09-15 ENCOUNTER — Other Ambulatory Visit: Payer: Self-pay

## 2023-09-15 DIAGNOSIS — I10 Essential (primary) hypertension: Secondary | ICD-10-CM | POA: Diagnosis not present

## 2023-09-15 DIAGNOSIS — Z79899 Other long term (current) drug therapy: Secondary | ICD-10-CM | POA: Diagnosis not present

## 2023-09-15 DIAGNOSIS — R040 Epistaxis: Secondary | ICD-10-CM | POA: Diagnosis present

## 2023-09-15 NOTE — ED Triage Notes (Addendum)
Pt continues to have nosebleeds started last Saturday This is her 3rd visit. Started today @1800  Not on any blood thinners

## 2023-09-16 ENCOUNTER — Emergency Department (HOSPITAL_BASED_OUTPATIENT_CLINIC_OR_DEPARTMENT_OTHER)
Admission: EM | Admit: 2023-09-16 | Discharge: 2023-09-16 | Disposition: A | Discharge status: Home / Self Care | Payer: 59 | Attending: Emergency Medicine | Admitting: Emergency Medicine

## 2023-09-16 ENCOUNTER — Encounter: Payer: Self-pay | Admitting: Family Medicine

## 2023-09-16 DIAGNOSIS — R040 Epistaxis: Secondary | ICD-10-CM

## 2023-09-16 LAB — BASIC METABOLIC PANEL
Anion gap: 10 (ref 5–15)
BUN: 23 mg/dL — ABNORMAL HIGH (ref 6–20)
CO2: 23 mmol/L (ref 22–32)
Calcium: 9.3 mg/dL (ref 8.9–10.3)
Chloride: 102 mmol/L (ref 98–111)
Creatinine, Ser: 0.76 mg/dL (ref 0.44–1.00)
GFR, Estimated: >60 mL/min (ref >60)
Glucose, Bld: 108 mg/dL — ABNORMAL HIGH (ref 70–99)
Potassium: 4 mmol/L (ref 3.5–5.1)
Sodium: 135 mmol/L (ref 135–145)

## 2023-09-16 LAB — CBC WITH DIFFERENTIAL/PLATELET
Abs Immature Granulocytes: 0.01 10*3/uL (ref 0.00–0.07)
Basophils Absolute: 0.1 10*3/uL (ref 0.0–0.1)
Basophils Relative: 1 %
Eosinophils Absolute: 0.1 10*3/uL (ref 0.0–0.5)
Eosinophils Relative: 2 %
HCT: 33.6 % — ABNORMAL LOW (ref 36.0–46.0)
Hemoglobin: 11.4 g/dL — ABNORMAL LOW (ref 12.0–15.0)
Immature Granulocytes: 0 %
Lymphocytes Relative: 33 %
Lymphs Abs: 1.6 10*3/uL (ref 0.7–4.0)
MCH: 30 pg (ref 26.0–34.0)
MCHC: 33.9 g/dL (ref 30.0–36.0)
MCV: 88.4 fL (ref 80.0–100.0)
Monocytes Absolute: 0.5 10*3/uL (ref 0.1–1.0)
Monocytes Relative: 10 %
Neutro Abs: 2.6 10*3/uL (ref 1.7–7.7)
Neutrophils Relative %: 54 %
Platelets: 245 10*3/uL (ref 150–400)
RBC: 3.8 MIL/uL — ABNORMAL LOW (ref 3.87–5.11)
RDW: 14.8 % (ref 11.5–15.5)
WBC: 4.8 10*3/uL (ref 4.0–10.5)
nRBC: 0 % (ref 0.0–0.2)

## 2023-09-16 LAB — PROTIME-INR
INR: 1 (ref 0.8–1.2)
Prothrombin Time: 13.8 s (ref 11.4–15.2)

## 2023-09-16 NOTE — Discharge Instructions (Addendum)
If bleeding recurs, apply direct pressure for at least 15 minutes.  Return to the ER if you are unable to get the bleeding stopped.  Use the Afrin nasal spray twice daily.  Follow-up with ear, nose, and throat if your bleeding persists.  The contact information for [facility] has been provided in this discharge summary for you to call and make these arrangements.

## 2023-09-16 NOTE — ED Provider Notes (Signed)
[place] EMERGENCY DEPARTMENT AT [facility] Provider Note   CSN: [id number] Arrival date & time: 09/15/23  2158     History  Chief Complaint  Patient presents with   Epistaxis    [name] [name] is a 52 y.o. female.  Patient is a 52 year old female with history of GERD, hypertension, and hyperlipidemia.  Patient presenting today with complaints of nosebleed.  She has been having intermittent nosebleeds for the past week.  She has been seen in urgent care, then here in the ER and had cauterization performed twice.  This evening, she began bleeding again and presents for evaluation of this.  She denies any injury or trauma.  A clothespin device was applied in triage and bleeding has stopped at the time of my evaluation.  Patient is not taking any blood thinners and denies any history of clotting disorder.  The history is provided by the patient.       Home Medications Prior to Admission medications   Medication Sig Start Date End Date Taking? Authorizing Provider  amLODipine (NORVASC) 10 MG tablet Take 1 tablet (10 mg total) by mouth daily. 01/18/23   Ardith Dark, MD  diazepam (VALIUM) 5 MG tablet Take one tablet shortly before MRI test; may repeat times 1 01/18/23   Ardith Dark, MD  famotidine (PEPCID) 20 MG tablet Take 20 mg by mouth daily as needed for heartburn or indigestion.    [provider]  ibuprofen (ADVIL) 800 MG tablet Take 1 tablet (800 mg total) by mouth every 8 (eight) hours as needed. 06/22/20   Olivia Mackie, MD  loratadine (CLARITIN) 10 MG tablet Take 10 mg by mouth daily.    [provider]  tamoxifen (NOLVADEX) 20 MG tablet Take 1 tablet (20 mg total) by mouth daily. 11/16/22   Rachel Moulds, MD  valACYclovir (VALTREX) 1000 MG tablet TAKE 2 TABLETS BY MOUTH EVERY 12 (TWELVE) HOURS. TAKE FOR 1 DAY 01/28/23   Ardith Dark, MD      Allergies    Patient has no known allergies.    Review of Systems   Review of Systems   All other systems reviewed and are negative.   Physical Exam Updated Vital Signs BP 138/87   Pulse (!) 120   Temp 97.9 F (36.6 C) (Oral)   Resp 18   Ht 5\' 4"  (1.626 m)   Wt 83.9 kg   LMP 05/29/2020   SpO2 100%   BMI 31.76 kg/m  Physical Exam Vitals and nursing note reviewed.  Constitutional:      General: She is not in acute distress.    Appearance: She is well-developed. She is not diaphoretic.  HENT:     Head: Normocephalic and atraumatic.     Nose:     Comments: There is no active bleeding noted within the right nares, but there is old blood present.  Left nares is clear.  Septum is midline. Cardiovascular:     Rate and Rhythm: Normal rate and regular rhythm.     Heart sounds: No murmur heard.    No friction rub. No gallop.  Pulmonary:     Effort: Pulmonary effort is normal. No respiratory distress.     Breath sounds: Normal breath sounds. No wheezing.  Abdominal:     General: Bowel sounds are normal. There is no distension.     Palpations: Abdomen is soft.     Tenderness: There is no abdominal tenderness.  Musculoskeletal:  General: Normal range of motion.     Cervical back: Normal range of motion and neck supple.  Skin:    General: Skin is warm and dry.  Neurological:     General: No focal deficit present.     Mental Status: She is alert and oriented to person, place, and time.     ED Results / Procedures / Treatments   Labs (all labs ordered are listed, but only abnormal results are displayed) Labs Reviewed - No data to display  EKG None  Radiology No results found.  Procedures Procedures    Medications Ordered in ED Medications - No data to display  ED Course/ Medical Decision Making/ A&P  Patient is a 52 year old female presenting with nosebleed.  This is her third visit between urgent care and the ER with similar complaints.  She has been cauterized on 2 prior occasions and bleeding recurred today.  At the time of my assessment, she  has had pressure held for 2 hours and bleeding has resolved.  I did obtain a CBC, metabolic panel, and PT/INR, all of which are basically unremarkable.  Her platelet count is normal and coags are normal.  At this point, she is not having any bleeding and I feel can safely be discharged.  I am hesitant to pack the nose since it is not actively bleeding and him also reluctant to perform further cauterization as I believe this will only further traumatize the mucosa and could lead to further bleeding in the upcoming days.  Patient to be discharged with direct pressure if bleeding recurs.  Final Clinical Impression(s) / ED Diagnoses Final diagnoses:  None    Rx / DC Orders ED Discharge Orders     None         Geoffery Lyons, MD 09/16/23 [phone]

## 2023-09-17 ENCOUNTER — Other Ambulatory Visit: Payer: Self-pay | Admitting: *Deleted

## 2023-09-17 DIAGNOSIS — R040 Epistaxis: Secondary | ICD-10-CM

## 2023-09-17 NOTE — Telephone Encounter (Signed)
Please advise 

## 2023-09-17 NOTE — Telephone Encounter (Signed)
Referral placed.

## 2023-09-17 NOTE — Telephone Encounter (Signed)
Ok to refer to ENT.  [name] [name]. Jimmey Ralph, MD 09/17/2023 11:40 AM

## 2023-09-19 ENCOUNTER — Other Ambulatory Visit: Payer: Self-pay | Admitting: *Deleted

## 2023-09-19 DIAGNOSIS — R040 Epistaxis: Secondary | ICD-10-CM

## 2023-09-19 NOTE — Telephone Encounter (Signed)
New ENT STAT referral send

## 2023-10-01 ENCOUNTER — Encounter (INDEPENDENT_AMBULATORY_CARE_PROVIDER_SITE_OTHER): Payer: Self-pay | Admitting: Otolaryngology

## 2023-10-09 ENCOUNTER — Encounter (INDEPENDENT_AMBULATORY_CARE_PROVIDER_SITE_OTHER): Payer: Self-pay | Admitting: Otolaryngology

## 2023-12-01 ENCOUNTER — Other Ambulatory Visit: Payer: Self-pay | Admitting: Hematology and Oncology

## 2024-01-16 ENCOUNTER — Other Ambulatory Visit: Payer: Self-pay | Admitting: Family Medicine

## 2024-01-21 ENCOUNTER — Encounter: Payer: 59 | Admitting: Family Medicine

## 2024-03-08 LAB — HM PAP SMEAR

## 2024-04-14 ENCOUNTER — Other Ambulatory Visit: Payer: Self-pay | Admitting: Family Medicine

## 2024-04-21 ENCOUNTER — Ambulatory Visit (INDEPENDENT_AMBULATORY_CARE_PROVIDER_SITE_OTHER): Admitting: Family Medicine

## 2024-04-21 ENCOUNTER — Encounter: Payer: Self-pay | Admitting: Family Medicine

## 2024-04-21 ENCOUNTER — Other Ambulatory Visit: Payer: Self-pay | Admitting: Family Medicine

## 2024-04-21 VITALS — BP 110/74 | HR 73 | Temp 97.5°F | Ht 64.0 in | Wt 187.6 lb

## 2024-04-21 DIAGNOSIS — Z9189 Other specified personal risk factors, not elsewhere classified: Secondary | ICD-10-CM

## 2024-04-21 DIAGNOSIS — N951 Menopausal and female climacteric states: Secondary | ICD-10-CM

## 2024-04-21 DIAGNOSIS — E785 Hyperlipidemia, unspecified: Secondary | ICD-10-CM | POA: Diagnosis not present

## 2024-04-21 DIAGNOSIS — Z114 Encounter for screening for human immunodeficiency virus [HIV]: Secondary | ICD-10-CM

## 2024-04-21 DIAGNOSIS — Z23 Encounter for immunization: Secondary | ICD-10-CM | POA: Diagnosis not present

## 2024-04-21 DIAGNOSIS — Z0001 Encounter for general adult medical examination with abnormal findings: Secondary | ICD-10-CM | POA: Diagnosis not present

## 2024-04-21 DIAGNOSIS — Z1159 Encounter for screening for other viral diseases: Secondary | ICD-10-CM

## 2024-04-21 DIAGNOSIS — R7303 Prediabetes: Secondary | ICD-10-CM

## 2024-04-21 DIAGNOSIS — I10 Essential (primary) hypertension: Secondary | ICD-10-CM | POA: Diagnosis not present

## 2024-04-21 DIAGNOSIS — B001 Herpesviral vesicular dermatitis: Secondary | ICD-10-CM

## 2024-04-21 LAB — COMPREHENSIVE METABOLIC PANEL WITH GFR
ALT: 27 U/L (ref 0–35)
AST: 27 U/L (ref 0–37)
Albumin: 4.5 g/dL (ref 3.5–5.2)
Alkaline Phosphatase: 39 U/L (ref 39–117)
BUN: 14 mg/dL (ref 6–23)
CO2: 28 meq/L (ref 19–32)
Calcium: 9.2 mg/dL (ref 8.4–10.5)
Chloride: 103 meq/L (ref 96–112)
Creatinine, Ser: 0.77 mg/dL (ref 0.40–1.20)
GFR: 87.21 mL/min (ref >60.00)
Glucose, Bld: 96 mg/dL (ref 70–99)
Potassium: 4.2 meq/L (ref 3.5–5.1)
Sodium: 138 meq/L (ref 135–145)
Total Bilirubin: 0.5 mg/dL (ref 0.2–1.2)
Total Protein: 7.3 g/dL (ref 6.0–8.3)

## 2024-04-21 LAB — LIPID PANEL
Cholesterol: 248 mg/dL — ABNORMAL HIGH (ref 0–200)
HDL: 78.7 mg/dL (ref >39.00)
LDL Cholesterol: 139 mg/dL — ABNORMAL HIGH (ref 0–99)
NonHDL: 169.16
Total CHOL/HDL Ratio: 3
Triglycerides: 152 mg/dL — ABNORMAL HIGH (ref 0.0–149.0)
VLDL: 30.4 mg/dL (ref 0.0–40.0)

## 2024-04-21 LAB — CBC
HCT: 40.6 % (ref 36.0–46.0)
Hemoglobin: 13.4 g/dL (ref 12.0–15.0)
MCHC: 32.9 g/dL (ref 30.0–36.0)
MCV: 89.8 fl (ref 78.0–100.0)
Platelets: 234 10*3/uL (ref 150.0–400.0)
RBC: 4.52 Mil/uL (ref 3.87–5.11)
RDW: 14.7 % (ref 11.5–15.5)
WBC: 3.2 10*3/uL — ABNORMAL LOW (ref 4.0–10.5)

## 2024-04-21 LAB — TSH: TSH: 2.08 u[IU]/mL (ref 0.35–5.50)

## 2024-04-21 LAB — HEMOGLOBIN A1C: Hgb A1c MFr Bld: 5.8 % (ref 4.6–6.5)

## 2024-04-21 MED ORDER — AMLODIPINE BESYLATE 10 MG PO TABS: 10.0000 mg | ORAL_TABLET | Freq: Every day | ORAL | 3 refills | Status: AC

## 2024-04-21 MED ORDER — VALACYCLOVIR HCL 1 G PO TABS: ORAL_TABLET | ORAL | 0 refills | Status: AC

## 2024-04-21 NOTE — Assessment & Plan Note (Signed)
 Stable on Valtrex  as needed for outbreaks.  Will refill today.

## 2024-04-21 NOTE — Assessment & Plan Note (Signed)
 Check lipids.  Discussed lifestyle modifications.  Will also check cardiac CT scan to further stratify.

## 2024-04-21 NOTE — Assessment & Plan Note (Signed)
At goal today on amlodipine 10 mg daily. 

## 2024-04-21 NOTE — Assessment & Plan Note (Signed)
 Check A1c.  Discussed lifestyle modifications.

## 2024-04-21 NOTE — Assessment & Plan Note (Signed)
 Following with GYN for this.  Recently had labs.  May be considering hormone replacement therapy - she will discuss further with GYN.

## 2024-04-21 NOTE — Progress Notes (Signed)
 Chief Complaint:  [name] [name] is a 53 y.o. female who presents today for her annual comprehensive physical exam.    Assessment/Plan:  Chronic Problems Addressed Today: Essential hypertension At goal today on amlodipine  10 mg daily.  Herpes labialis Stable on Valtrex  as needed for outbreaks.  Will refill today.  Dyslipidemia Check lipids.  Discussed lifestyle modifications.  Will also check cardiac CT scan to further stratify.  Prediabetes Check A1c.  Discussed lifestyle modifications.  Menopausal symptoms Following with GYN for this.  Recently had labs.  May be considering hormone replacement therapy - she will discuss further with GYN.   Preventative Healthcare: Follows with GYN for women's health.  Check labs.  Shingrix  given today.  Up-to-date on colon cancer screening.  Patient Counseling(The following topics were reviewed and/or handout was given):  -Nutrition: Stressed importance of moderation in sodium/caffeine intake, saturated fat and cholesterol, caloric balance, sufficient intake of fresh fruits, vegetables, and fiber.  -Stressed the importance of regular exercise.   -Substance Abuse: Discussed cessation/primary prevention of tobacco, alcohol, or other drug use; driving or other dangerous activities under the influence; availability of treatment for abuse.   -Injury prevention: Discussed safety belts, safety helmets, smoke detector, smoking near bedding or upholstery.   -Sexuality: Discussed sexually transmitted diseases, partner selection, use of condoms, avoidance of unintended pregnancy and contraceptive alternatives.   -Dental health: Discussed importance of regular tooth brushing, flossing, and dental visits.  -Health maintenance and immunizations reviewed. Please refer to Health maintenance section.  Return to care in 1 year for next preventative visit.     Subjective:  HPI:  She has no acute complaints today. She is here today for annual physical.  See A/P for status of chronic conditions.  Lifestyle Diet: Balanced. Trying to get plenty of fruits and vegetables.  Exercise: Tries to do a lot of walking.      04/21/2024    9:10 AM  Depression screen PHQ 2/9  Decreased Interest 0  Down, Depressed, Hopeless 0  PHQ - 2 Score 0    Health Maintenance Due  Topic Date Due   HIV Screening  Never done   Hepatitis C Screening  Never done   Hepatitis B Vaccines (1 of 3 - 19+ 3-dose series) Never done   Cervical Cancer Screening (HPV/Pap Cotest)  08/01/2019   Zoster Vaccines- Shingrix  (2 of 2) 03/15/2023   MAMMOGRAM  03/04/2024     ROS: Per HPI, otherwise a complete review of systems was negative.   PMH:  The following were reviewed and entered/updated in epic: Past Medical History:  Diagnosis Date   Arthritis    Genital warts    GERD (gastroesophageal reflux disease)    Hypertension    Patient Active Problem List   Diagnosis Date Noted   Menopausal symptoms 04/21/2024   Prediabetes 01/18/2023   Dyslipidemia 09/06/2021   Breast cancer screening, high risk patient 11/10/2020   At high risk for breast cancer 11/10/2020   Herpes labialis 03/14/2018   GERD (gastroesophageal reflux disease) 09/17/2017   Essential hypertension 09/17/2017   Past Surgical History:  Procedure Laterality Date   APPENDECTOMY  1998   AND OVARIAN CYSTECTOMY   CESAREAN SECTION  10-15-2019   @[facility]    AND CLOSURE LARGE CYSTOTOMY   DILATION AND CURETTAGE OF UTERUS  11-07-2007  @[facility]    w/ suction for retained placenta   HAND SURGERY Right 09/2019   removal of bone for arthritis   HYSTEROSCOPY N/A 06/22/2020   Procedure: HYSTEROSCOPY;  Surgeon:  Gorge Ade, MD;  Location: [facility];  Service: Gynecology;  Laterality: N/A;   LAPAROTOMY W/ OVARIAN CYSTECTOMY  1999   LYMPH NODE DISSECTION  2015 approx.   per pt upper chest area, stated benign   OVARIAN CYST REMOVAL  1999   TONSILLECTOMY AND ADENOIDECTOMY  1980    Family History   Problem Relation Age of Onset   Hyperlipidemia Mother    Cancer Mother        Breast   Hypertension Mother    Heart disease Mother    Osteoarthritis Mother    Hyperlipidemia Father    Hypertension Father    Hearing loss Father    Gout Father    Stroke Paternal Grandmother    Birth defects Brother        CP   Heart attack Maternal Grandmother    Alcohol abuse Paternal Uncle     Medications- reviewed and updated Current Outpatient Medications  Medication Sig Dispense Refill   diazepam  (VALIUM ) 5 MG tablet Take one tablet shortly before MRI test; may repeat times 1 2 tablet 0   famotidine (PEPCID) 20 MG tablet Take 20 mg by mouth daily as needed for heartburn or indigestion.     ibuprofen  (ADVIL ) 800 MG tablet Take 1 tablet (800 mg total) by mouth every 8 (eight) hours as needed. 30 tablet 0   loratadine (CLARITIN) 10 MG tablet Take 10 mg by mouth daily.     tamoxifen  (NOLVADEX ) 20 MG tablet TAKE 1 TABLET BY MOUTH EVERY DAY 90 tablet 4   amLODipine  (NORVASC ) 10 MG tablet Take 1 tablet (10 mg total) by mouth daily. 90 tablet 3   valACYclovir  (VALTREX ) 1000 MG tablet TAKE 2 TABLETS BY MOUTH EVERY 12 (TWELVE) HOURS. TAKE FOR 1 DAY 180 tablet 0   No current facility-administered medications for this visit.    Allergies-reviewed and updated No Known Allergies  Social History   Socioeconomic History   Marital status: Married    Spouse name: Not on file   Number of children: 2   Years of education: Not on file   Highest education level: Not on file  Occupational History   Occupation: Occupational hygienist  Tobacco Use   Smoking status: Former    Current packs/day: 0.00    Types: Cigarettes    Start date: 06/15/1985    Quit date: 06/16/1987    Years since quitting: 36.8   Smokeless tobacco: Never  Vaping Use   Vaping status: Never Used  Substance and Sexual Activity   Alcohol use: Yes    Alcohol/week: 21.0 standard drinks of alcohol    Types: 21 Glasses of wine per week     Comment: per pt 3 wine daily   Drug use: Never   Sexual activity: Yes    Partners: Male    Birth control/protection: Post-menopausal  Other Topics Concern   Not on file  Social History Narrative   Has twins.  Banker   Social Drivers of Corporate investment banker Strain: Not on file  Food Insecurity: Not on file  Transportation Needs: Not on file  Physical Activity: Not on file  Stress: Not on file  Social Connections: Not on file        Objective:  Physical Exam: BP 110/74   Pulse 73   Temp (!) 97.5 F (36.4 C) (Temporal)   Ht 5' 4 (1.626 m)   Wt 187 lb 9.6 oz (85.1 kg)   LMP 05/29/2020   SpO2 96%  BMI 32.20 kg/m   Body mass index is 32.2 kg/m. Wt Readings from Last 3 Encounters:  04/21/24 187 lb 9.6 oz (85.1 kg)  09/15/23 185 lb (83.9 kg)  09/11/23 185 lb (83.9 kg)   Gen: NAD, resting comfortably HEENT: TMs normal bilaterally. OP clear. No thyromegaly noted.  CV: RRR with no murmurs appreciated Pulm: NWOB, CTAB with no crackles, wheezes, or rhonchi GI: Normal bowel sounds present. Soft, Nontender, Nondistended. MSK: no edema, cyanosis, or clubbing noted Skin: warm, dry Neuro: CN2-12 grossly intact. Strength 5/5 in upper and lower extremities. Reflexes symmetric and intact bilaterally.  Psych: Normal affect and thought content     Trevelle Mcgurn M. Kennyth, MD 04/21/2024 9:42 AM

## 2024-04-21 NOTE — Patient Instructions (Signed)
 It was very nice to see you today!  We will check lab work today.  I will refill your medications today.  We will give your shingles vaccine today.  Will order a cardiac CT scan to look for any calcifications in the arteries in your heart.  They will call to schedule this soon.  Please continue to work on diet and exercise.  I will see back in a year for your annual physical.  Please come back sooner if needed.  Return in about 1 year (around 04/21/2025) for Annual Physical.   Take care, Dr Kennyth  PLEASE NOTE:  If you had any lab tests, please let us  know if you have not heard back within a few days. You may see your results on mychart before we have a chance to review them but we will give you a call once they are reviewed by us .   If we ordered any referrals today, please let us  know if you have not heard from their office within the next week.   If you had any urgent prescriptions sent in today, please check with the pharmacy within an hour of our visit to make sure the prescription was transmitted appropriately.   Please try these tips to maintain a healthy lifestyle:  Eat at least 3 REAL meals and 1-2 snacks per day.  Aim for no more than 5 hours between eating.  If you eat breakfast, please do so within one hour of getting up.   Each meal should contain half fruits/vegetables, one quarter protein, and one quarter carbs (no bigger than a computer mouse)  Cut down on sweet beverages. This includes juice, soda, and sweet tea.   Drink at least 1 glass of water with each meal and aim for at least 8 glasses per day  Exercise at least 150 minutes every week.    Preventive Care 47-43 Years Old, Female Preventive care refers to lifestyle choices and visits with your health care provider that can promote health and wellness. Preventive care visits are also called wellness exams. What can I expect for my preventive care visit? Counseling Your health care provider may ask you  questions about your: Medical history, including: Past medical problems. Family medical history. Pregnancy history. Current health, including: Menstrual cycle. Method of birth control. Emotional well-being. Home life and relationship well-being. Sexual activity and sexual health. Lifestyle, including: Alcohol, nicotine or tobacco, and drug use. Access to firearms. Diet, exercise, and sleep habits. Work and work Astronomer. Sunscreen use. Safety issues such as seatbelt and bike helmet use. Physical exam Your health care provider will check your: Height and weight. These may be used to calculate your BMI (body mass index). BMI is a measurement that tells if you are at a healthy weight. Waist circumference. This measures the distance around your waistline. This measurement also tells if you are at a healthy weight and may help predict your risk of certain diseases, such as type 2 diabetes and high blood pressure. Heart rate and blood pressure. Body temperature. Skin for abnormal spots. What immunizations do I need?  Vaccines are usually given at various ages, according to a schedule. Your health care provider will recommend vaccines for you based on your age, medical history, and lifestyle or other factors, such as travel or where you work. What tests do I need? Screening Your health care provider may recommend screening tests for certain conditions. This may include: Lipid and cholesterol levels. Diabetes screening. This is done by checking your blood  sugar (glucose) after you have not eaten for a while (fasting). Pelvic exam and Pap test. Hepatitis B test. Hepatitis C test. HIV (human immunodeficiency virus) test. STI (sexually transmitted infection) testing, if you are at risk. Lung cancer screening. Colorectal cancer screening. Mammogram. Talk with your health care provider about when you should start having regular mammograms. This may depend on whether you have a family  history of breast cancer. BRCA-related cancer screening. This may be done if you have a family history of breast, ovarian, tubal, or peritoneal cancers. Bone density scan. This is done to screen for osteoporosis. Talk with your health care provider about your test results, treatment options, and if necessary, the need for more tests. Follow these instructions at home: Eating and drinking  Eat a diet that includes fresh fruits and vegetables, whole grains, lean protein, and low-fat dairy products. Take vitamin and mineral supplements as recommended by your health care provider. Do not drink alcohol if: Your health care provider tells you not to drink. You are pregnant, may be pregnant, or are planning to become pregnant. If you drink alcohol: Limit how much you have to 0-1 drink a day. Know how much alcohol is in your drink. In the [place] one drink equals one 12 oz bottle of beer (355 mL), one 5 oz glass of wine (148 mL), or one 1 oz glass of hard liquor (44 mL). Lifestyle Brush your teeth every morning and night with fluoride toothpaste. Floss one time each day. Exercise for at least 30 minutes 5 or more days each week. Do not use any products that contain nicotine or tobacco. These products include cigarettes, chewing tobacco, and vaping devices, such as e-cigarettes. If you need help quitting, ask your health care provider. Do not use drugs. If you are sexually active, practice safe sex. Use a condom or other form of protection to prevent STIs. If you do not wish to become pregnant, use a form of birth control. If you plan to become pregnant, see your health care provider for a prepregnancy visit. Take aspirin only as told by your health care provider. Make sure that you understand how much to take and what form to take. Work with your health care provider to find out whether it is safe and beneficial for you to take aspirin daily. Find healthy ways to manage stress, such as: Meditation,  yoga, or listening to music. Journaling. Talking to a trusted person. Spending time with friends and family. Minimize exposure to UV radiation to reduce your risk of skin cancer. Safety Always wear your seat belt while driving or riding in a vehicle. Do not drive: If you have been drinking alcohol. Do not ride with someone who has been drinking. When you are tired or distracted. While texting. If you have been using any mind-altering substances or drugs. Wear a helmet and other protective equipment during sports activities. If you have firearms in your house, make sure you follow all gun safety procedures. Seek help if you have been physically or sexually abused. What's next? Visit your health care provider once a year for an annual wellness visit. Ask your health care provider how often you should have your eyes and teeth checked. Stay up to date on all vaccines. This information is not intended to replace advice given to you by your health care provider. Make sure you discuss any questions you have with your health care provider. Document Revised: 04/12/2021 Document Reviewed: 04/12/2021 Elsevier Patient Education  2024 [employer].

## 2024-04-22 LAB — HIV ANTIBODY (ROUTINE TESTING W REFLEX): HIV 1&2 Ab, 4th Generation: NONREACTIVE

## 2024-04-22 LAB — HEPATITIS C ANTIBODY: Hepatitis C Ab: NONREACTIVE

## 2024-04-24 ENCOUNTER — Ambulatory Visit: Payer: Self-pay | Admitting: Family Medicine

## 2024-04-24 NOTE — Progress Notes (Signed)
 Cholesterol and blood sugar are borderline elevated.  Similar to previous values.  Do not need to start meds but she should continue to work on diet and exercise and we can recheck these again in a year or so.  The rest of her labs are all stable and we can recheck again in a year.

## 2024-08-04 LAB — HM MAMMOGRAPHY

## 2024-08-05 ENCOUNTER — Encounter: Payer: Self-pay | Admitting: Family Medicine

## 2024-11-19 ENCOUNTER — Ambulatory Visit (INDEPENDENT_AMBULATORY_CARE_PROVIDER_SITE_OTHER): Admitting: Family Medicine

## 2024-11-19 ENCOUNTER — Encounter: Payer: Self-pay | Admitting: Family Medicine

## 2024-11-19 ENCOUNTER — Ambulatory Visit: Payer: Self-pay | Admitting: *Deleted

## 2024-11-19 VITALS — BP 122/76 | HR 102 | Temp 99.6°F | Ht 64.0 in | Wt 178.0 lb

## 2024-11-19 DIAGNOSIS — R059 Cough, unspecified: Secondary | ICD-10-CM

## 2024-11-19 DIAGNOSIS — R6889 Other general symptoms and signs: Secondary | ICD-10-CM | POA: Diagnosis not present

## 2024-11-19 DIAGNOSIS — J101 Influenza due to other identified influenza virus with other respiratory manifestations: Secondary | ICD-10-CM | POA: Diagnosis not present

## 2024-11-19 LAB — POC INFLUENZA A&B (BINAX/QUICKVUE)
Influenza A, POC: NEGATIVE
Influenza B, POC: POSITIVE — AB

## 2024-11-19 MED ORDER — OSELTAMIVIR PHOSPHATE 75 MG PO CAPS: 75.0000 mg | ORAL_CAPSULE | Freq: Two times a day (BID) | ORAL | 0 refills | Status: AC

## 2024-11-19 MED ORDER — ALBUTEROL SULFATE HFA 108 (90 BASE) MCG/ACT IN AERS: 2.0000 | INHALATION_SPRAY | Freq: Four times a day (QID) | RESPIRATORY_TRACT | 0 refills | Status: AC | PRN

## 2024-11-19 MED ORDER — PROMETHAZINE-DM 6.25-15 MG/5ML PO SYRP: 5.0000 mL | ORAL_SOLUTION | Freq: Four times a day (QID) | ORAL | 0 refills | Status: AC | PRN

## 2024-11-19 NOTE — Telephone Encounter (Signed)
 FYI Only or Action Required?: FYI only for provider: appointment scheduled on 1/22.  Patient was last seen in primary care on 04/21/2024 by Kennyth Worth HERO, MD.  Called Nurse Triage reporting Cough.  Symptoms began yesterday.  Interventions attempted: Rest, hydration, or home remedies.  Symptoms are: gradually worsening.  Triage Disposition: See Physician Within 24 Hours  Patient/caregiver understands and will follow disposition?: Yes   Message from Mia F sent at 11/19/2024 10:54 AM EST  Reason for Triage: Very bad cough that is productive and in her chest. No SOB or difficulty breathing. Worse at night. She feel like she is unable to breathe when the cough gets bad enough. No fever. Started yesterday. She was exposed to the flu but took a test that was negative.   Reason for Disposition  [1] Continuous (nonstop) coughing interferes with work or school AND [2] no improvement using cough treatment per Care Advice  Answer Assessment - Initial Assessment Questions Patient reports recent travel with both children testing + flu A. Patient has done 2 home tests- both negative. Patient started with cough and fatigue. No fever   1. ONSET: When did the cough begin?      Yesterday- recent travel 2. SEVERITY: How bad is the cough today?      Coughing spells and constant cough at times 3. SPUTUM: Describe the color of your sputum (e.g., none, dry cough; clear, white, yellow, green)     No color- white 4. HEMOPTYSIS: Are you coughing up any blood? If Yes, ask: How much? (e.g., flecks, streaks, tablespoons, etc.)     no 5. DIFFICULTY BREATHING: Are you having difficulty breathing? If Yes, ask: How bad is it? (e.g., mild, moderate, severe)      Only when coughing - heavy suptum  6. FEVER: Do you have a fever? If Yes, ask: What is your temperature, how was it measured, and when did it start?     no 7. CARDIAC HISTORY: Do you have any history of heart disease? (e.g., heart  attack, congestive heart failure)      hypertension 8. LUNG HISTORY: Do you have any history of lung disease?  (e.g., pulmonary embolus, asthma, emphysema)     no 9. PE RISK FACTORS: Do you have a history of blood clots? (or: recent major surgery, recent prolonged travel, bedridden)     Recent travel 10. OTHER SYMPTOMS: Do you have any other symptoms? (e.g., runny nose, wheezing, chest pain)       Slight headache, aching- from cough  12. TRAVEL: Have you traveled out of the country in the last month? (e.g., travel history, exposures)       Both children tested + flu A  Protocols used: Cough - Acute Productive-A-AH

## 2024-11-19 NOTE — Patient Instructions (Addendum)
 Follow up as needed or as scheduled START the Tamiflu  twice daily USE the cough syrup as needed USE the Albuterol - 2 puffs as needed for cough, wheezing, shortness of breath Drink LOTS of fluids REST! Call with any questions or concerns Stay Safe!  Stay Healthy!

## 2024-11-19 NOTE — Progress Notes (Signed)
" ° °  Subjective:    Patient ID: [name] [name], female    DOB: Mar 30, 1971, 54 y.o.   MRN: [id number]  HPI URI- sxs started yesterday w/ cough.  Some body aches- thinks it's from cough.  + HA.  Denies fever or chills.  + sick contacts w/ flu.  Denies sinus pain/pressure.  No ear pain.  + mild nasal congestion.   Review of Systems For ROS see HPI     Objective:   Physical Exam Vitals reviewed.  Constitutional:      General: She is not in acute distress.    Appearance: Normal appearance. She is well-developed. She is not ill-appearing.     Comments: Obviously not feeling well  HENT:     Head: Normocephalic and atraumatic.     Right Ear: Tympanic membrane normal.     Left Ear: Tympanic membrane normal.     Nose: Mucosal edema and congestion present. No rhinorrhea.     Right Sinus: No maxillary sinus tenderness or frontal sinus tenderness.     Left Sinus: No maxillary sinus tenderness or frontal sinus tenderness.     Mouth/Throat:     Pharynx: Uvula midline. Posterior oropharyngeal erythema present. No oropharyngeal exudate.  Eyes:     Conjunctiva/sclera: Conjunctivae normal.     Pupils: Pupils are equal, round, and reactive to light.  Cardiovascular:     Rate and Rhythm: Normal rate and regular rhythm.     Heart sounds: Normal heart sounds.  Pulmonary:     Effort: Pulmonary effort is normal. No respiratory distress.     Breath sounds: Normal breath sounds. No wheezing.     Comments: + cough Musculoskeletal:     Cervical back: Normal range of motion and neck supple.  Lymphadenopathy:     Cervical: No cervical adenopathy.  Skin:    General: Skin is warm.  Neurological:     General: No focal deficit present.     Mental Status: She is alert and oriented to person, place, and time.  Psychiatric:        Mood and Affect: Mood normal.        Behavior: Behavior normal.        Thought Content: Thought content normal.           Assessment & Plan:  Flu B- new.  Pt's rapid  test in office w/ faint +.  Sxs and PE consistent w/ dx.  Start Tamiflu  BID.  Add cough syrup and albuterol  prn.  Reviewed supportive care and red flags that should prompt return.  Pt expressed understanding and is in agreement w/ plan.   "

## 2024-11-19 NOTE — Telephone Encounter (Signed)
 Noted, appt today.

## 2025-04-22 ENCOUNTER — Encounter: Admitting: Family Medicine
